# Patient Record
Sex: Male | Born: 1975 | Race: White | Hispanic: No | Marital: Married | Smoking: Never smoker
Health system: Southern US, Community
[De-identification: ages and names within clinical notes are randomized; demographics above are authoritative.]

## PROBLEM LIST (undated history)

## (undated) HISTORY — PX: LIPECTOMY: SHX11

## (undated) HISTORY — PX: CHOLECYSTECTOMY: SHX55

---

## 2011-12-20 ENCOUNTER — Ambulatory Visit (INDEPENDENT_AMBULATORY_CARE_PROVIDER_SITE_OTHER): Payer: BC Managed Care – PPO | Admitting: Family Medicine

## 2011-12-20 ENCOUNTER — Encounter: Payer: Self-pay | Admitting: Family Medicine

## 2011-12-20 VITALS — BP 123/79 | HR 93 | Temp 98.4°F | Ht 70.0 in | Wt 225.0 lb

## 2011-12-20 DIAGNOSIS — M79609 Pain in unspecified limb: Secondary | ICD-10-CM

## 2011-12-20 DIAGNOSIS — M79669 Pain in unspecified lower leg: Secondary | ICD-10-CM

## 2011-12-20 NOTE — Patient Instructions (Signed)
You have strained your medial right calf. Compression sleeve or ace wrap to help with swelling and pain - wear during day when up and walking around, definitely with exercise until pain has resolved. Icing for 15 minutes at a time 3-4 times a day, after exercise. Do 5-10 minute warmup jog then stretch your calf muscles - hold for 20 seconds, repeat 3 times. Heel lifts either in temporary orthotics or on their own to prevent further strain. Tylenol and/or aleve for pain. Heel raise exercise when able - toes forward, pointed out, pointed in - start with 3 sets of 6 - advance to 3 sets of 10.  When tolerated you can add a backpack.  Final step is to do these exercises on a step with a little bit of lowering. Avoid hills, inclines as much as possible. Cycling with low resistance or swimming for cross training in meantime. Follow up with me in 6 weeks for reevaluation.

## 2011-12-21 ENCOUNTER — Encounter: Payer: Self-pay | Admitting: Family Medicine

## 2011-12-21 DIAGNOSIS — M79669 Pain in unspecified lower leg: Secondary | ICD-10-CM | POA: Insufficient documentation

## 2011-12-21 NOTE — Assessment & Plan Note (Signed)
Right calf strain - prior issues with heel cords, PF, achilles tendinitis - has made adjustments to his activities (new basketball shoes, running shoes), and injured knee and ankle recently - all likely contributory.  No evidence DVT by exam and reproduced with MSK maneuvers.  Heel lifts provided, shown home strengthening and stretching program to do daily for next 6 weeks.  Avoid hills as much as possible.  Icing, tylenol/nsaids for pain.  Calf sleeve provided for compression.  Consider formal PT if not improving as expected.  See instructions for further.

## 2011-12-21 NOTE — Progress Notes (Signed)
  Subjective:    Patient ID: Stephen Osborn, male    DOB: January 12, 1976, 36 y.o.   MRN: [id number]  PCP: Dr. Leonette Most  HPI 36 yo M here for R calf injury  Patient reports he has previously had problems with plantar fascia and achilles (last year). 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 Runs and does circuit training. He has done several things last few weeks that thinks has contributed to right medial calf pain. Foot problems recurred in early March, had a right knee injury 2-3 weeks before calf injury/pain started. Turned his left ankle in basketball as well. Also tried finger shoes - shortly after this developed bilateral calf tightness medially - left side improved while right side had persisted. No chest pain, shortness of breath. Has improved some since this started. Has orthotics and has been doing achilles stretching exercises. Been cross training with swimming, elliptical, stationary bike. Occasional ibuprofen. Not using a sleeve or heel lifts in running shoes.  Uses the orthotics in regular shoes.  History reviewed. No pertinent past medical history.  No current outpatient prescriptions on file prior to visit.    Past Surgical History  Procedure Date  . Cholecystectomy     No Known Allergies  History   Social History  . Marital Status: Married    Spouse Name: N/A    Number of Children: N/A  . Years of Education: N/A   Occupational History  . Not on file.   Social History Main Topics  . Smoking status: Never Smoker   . Smokeless tobacco: Not on file  . Alcohol Use: Not on file  . Drug Use: Not on file  . Sexually Active: Not on file   Other Topics Concern  . Not on file   Social History Narrative  . No narrative on file    Family History  Problem Relation Age of Onset  . Sudden death Neg Hx   . Hypertension Neg Hx   . Heart attack Neg Hx   . Diabetes Neg Hx   . Hyperlipidemia Neg Hx       BP 123/79  Pulse 93  Temp(Src) 98.4 F (36.9 C) (Oral)  Ht 5\' 10"  (1.778 m)  Wt 225 lb (102.059 kg)  BMI 32.28 kg/m2  Review of Systems See HPI above.    Objective:   Physical Exam Gen: NAD  R leg: No gross deformity, swelling, bruising, erythema, cords. TTP medial gastroc reproducing his pain.  No achilles, foot or ankle TTP. FROM ankle and knee.  Pain reproduced with calf raise.  Negative homans. Strength 5/5 with all motions of ankle. NVI distally.  L leg: No gross deformity, swelling, bruising, erythema. No focal TTP within calf. FROM ankle and knee with 5/5 strength all ankle motions. NVI distally.     Assessment & Plan:  1. Right calf strain - prior issues with heel cords, PF, achilles tendinitis - has made adjustments to his activities (new basketball shoes, running shoes), and injured knee and ankle recently - all likely contributory.  No evidence DVT by exam and reproduced with MSK maneuvers.  Heel lifts provided, shown home strengthening and stretching program to do daily for next 6 weeks.  Avoid hills as much as possible.  Icing, tylenol/nsaids for pain.  Calf sleeve provided for compression.  Consider formal PT if not improving as expected.  See instructions for further.

## 2012-01-31 ENCOUNTER — Ambulatory Visit: Payer: BC Managed Care – PPO | Admitting: Family Medicine

## 2012-09-24 ENCOUNTER — Ambulatory Visit (INDEPENDENT_AMBULATORY_CARE_PROVIDER_SITE_OTHER): Payer: BC Managed Care – PPO | Admitting: Family Medicine

## 2012-09-24 ENCOUNTER — Encounter: Payer: Self-pay | Admitting: Family Medicine

## 2012-09-24 VITALS — BP 131/75 | HR 67 | Ht 70.0 in | Wt 214.0 lb

## 2012-09-24 DIAGNOSIS — M25561 Pain in right knee: Secondary | ICD-10-CM

## 2012-09-24 DIAGNOSIS — M25569 Pain in unspecified knee: Secondary | ICD-10-CM

## 2012-09-24 DIAGNOSIS — M7651 Patellar tendinitis, right knee: Secondary | ICD-10-CM

## 2012-09-24 DIAGNOSIS — M7650 Patellar tendinitis, unspecified knee: Secondary | ICD-10-CM

## 2012-09-24 MED ORDER — NITROGLYCERIN 0.2 MG/HR TD PT24: MEDICATED_PATCH | TRANSDERMAL | Status: DC

## 2012-09-24 NOTE — Patient Instructions (Addendum)
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 Start with physical therapy and home exercise program - do exercises daily for next 6 weeks. Ice 15 minutes at a time 3-4 times a day as needed. Aleve 2 tabs twice a day OR ibuprofen 3 tabs three times a day with food for pain and inflammation. Avoid jumping activities, deep squats, lunges, stairmaster while you're rehabilitating this. Otherwise it's activities as tolerated. Use 1/4th of a nitro patch (will ask pharmacy to cut this for you if they can) over the affected knee and change this every day - put in a little different area each day. If headaches are severe you can stop and give me a call but they're usually very mild, go away within a week if you get them. Patellar strap may help as well but you've tried this already. Follow up with me in 6 weeks for reevaluation.

## 2012-09-24 NOTE — Progress Notes (Signed)
  Subjective:    Patient ID: Stephen Osborn, male    DOB: [birth year], 37 y.o.   MRN: [id number]  PCP: Dr. Leonette Most  HPI 37 yo M here for right knee pain.  Patient reports he's had pain in right knee for about a year. Pain has been anterior and not gone away despite rest. Worse after he plays basketball, going down stairs. No catching, locking, giving out. Questionable swelling previously. Has been alternating ice and heat, taking ibuprofen. Tried patellar strap without much relief.  History reviewed. No pertinent past medical history.  Current Outpatient Prescriptions on File Prior to Visit  Medication Sig Dispense Refill  . glucosamine-chondroitin 500-400 MG tablet Take 1 tablet by mouth 3 (three) times daily.      . Multiple Vitamin (MULTIVITAMIN WITH MINERALS) TABS Take 1 tablet by mouth daily.       No current facility-administered medications on file prior to visit.    Past Surgical History  Procedure Laterality Date  . Cholecystectomy      No Known Allergies  History   Social History  . Marital Status: Married    Spouse Name: N/A    Number of Children: N/A  . Years of Education: N/A   Occupational History  . Not on file.   Social History Main Topics  . Smoking status: Never Smoker   . Smokeless tobacco: Not on file  . Alcohol Use: Not on file  . Drug Use: Not on file  . Sexually Active: Not on file   Other Topics Concern  . Not on file   Social History Narrative  . No narrative on file    Family History  Problem Relation Age of Onset  . Sudden death Neg Hx   . Hypertension Neg Hx   . Heart attack Neg Hx   . Diabetes Neg Hx   . Hyperlipidemia Neg Hx     BP 131/75  Pulse 67  Ht 5\' 10"  (1.778 m)  Wt 214 lb (97.07 kg)  BMI 30.71 kg/m2  Review of Systems See HPI above.    Objective:   Physical Exam Gen: NAD  R knee: No gross deformity, ecchymoses, swelling. TTP patellar tendon.  No joint line, post patellar facet, other  TTP. FROM. Negative ant/post drawers. Negative valgus/varus testing. Negative lachmanns. Negative mcmurrays, apleys, patellar apprehension, clarkes, thessalys. NV intact distally.    Assessment & Plan:  1. Right knee pain - 2/2 patellar tendinopathy.  Start formal PT, home exercise program.  Icing, nsaids.  Avoid jumping, deep squats and lunges.  Given length of time this has been going on will also trial nitro patches.  Patellar strap hasn't been helpful to him.  F/u in 6 weeks for reevaluation.  No evidence of intraarticular pathology.

## 2012-09-24 NOTE — Assessment & Plan Note (Signed)
2/2 patellar tendinopathy.  Start formal PT, home exercise program.  Icing, nsaids.  Avoid jumping, deep squats and lunges.  Given length of time this has been going on will also trial nitro patches.  Patellar strap hasn't been helpful to him.  F/u in 6 weeks for reevaluation.  No evidence of intraarticular pathology.

## 2012-09-25 ENCOUNTER — Ambulatory Visit: Payer: BC Managed Care – PPO | Attending: Family Medicine | Admitting: Rehabilitation

## 2012-09-25 DIAGNOSIS — M25569 Pain in unspecified knee: Secondary | ICD-10-CM | POA: Insufficient documentation

## 2012-09-25 DIAGNOSIS — IMO0001 Reserved for inherently not codable concepts without codable children: Secondary | ICD-10-CM | POA: Insufficient documentation

## 2012-09-27 ENCOUNTER — Ambulatory Visit: Payer: BC Managed Care – PPO | Admitting: Rehabilitation

## 2012-10-03 ENCOUNTER — Ambulatory Visit: Payer: BC Managed Care – PPO | Admitting: Rehabilitation

## 2012-10-09 ENCOUNTER — Ambulatory Visit: Payer: BC Managed Care – PPO | Admitting: Rehabilitation

## 2012-10-11 ENCOUNTER — Ambulatory Visit: Payer: BC Managed Care – PPO | Admitting: Rehabilitation

## 2012-10-14 ENCOUNTER — Ambulatory Visit: Payer: BC Managed Care – PPO | Admitting: Rehabilitation

## 2012-10-16 ENCOUNTER — Ambulatory Visit: Payer: BC Managed Care – PPO | Attending: Family Medicine | Admitting: Rehabilitation

## 2012-10-16 DIAGNOSIS — M25569 Pain in unspecified knee: Secondary | ICD-10-CM | POA: Insufficient documentation

## 2012-10-16 DIAGNOSIS — IMO0001 Reserved for inherently not codable concepts without codable children: Secondary | ICD-10-CM | POA: Insufficient documentation

## 2012-10-22 ENCOUNTER — Ambulatory Visit: Payer: BC Managed Care – PPO | Admitting: Rehabilitation

## 2012-10-24 ENCOUNTER — Ambulatory Visit: Payer: BC Managed Care – PPO | Admitting: Rehabilitation

## 2012-11-05 ENCOUNTER — Ambulatory Visit: Payer: BC Managed Care – PPO | Admitting: Family Medicine

## 2012-11-07 ENCOUNTER — Encounter: Payer: Self-pay | Admitting: Family Medicine

## 2012-11-07 ENCOUNTER — Ambulatory Visit (INDEPENDENT_AMBULATORY_CARE_PROVIDER_SITE_OTHER): Payer: BC Managed Care – PPO | Admitting: Family Medicine

## 2012-11-07 VITALS — BP 122/72 | HR 67 | Ht 70.0 in | Wt 219.0 lb

## 2012-11-07 DIAGNOSIS — M25561 Pain in right knee: Secondary | ICD-10-CM

## 2012-11-07 DIAGNOSIS — M25569 Pain in unspecified knee: Secondary | ICD-10-CM

## 2012-11-07 NOTE — Patient Instructions (Addendum)
Continue nitro patches, exercises. Follow up in 6 weeks.

## 2012-11-07 NOTE — Assessment & Plan Note (Signed)
2/2 patellar tendinopathy.  Doing very well, improved.  Not much tenderness now either.  Continue home exercises, icing, nsaids as needed.  Nitro patches for at least another 6 weeks then will reassess.  Avoid cutting, jumping sports though he probably would do ok with these (doing so as a precaution).  F/u in 6 weeks.

## 2012-11-07 NOTE — Progress Notes (Signed)
  Subjective:    Patient ID: Stephen Osborn, male    DOB: 1976/03/29, 37 y.o.   MRN: [id number]  PCP: Dr. Leonette Most  HPI  37 yo M here for f/u right knee pain.  3/11: Patient reports he's had pain in right knee for about a year. Pain has been anterior and not gone away despite rest. Worse after he plays basketball, going down stairs. No catching, locking, giving out. Questionable swelling previously. Has been alternating ice and heat, taking ibuprofen. Tried patellar strap without much relief.  4/24: Patient reports he has improved quite a bit since last visit. Doing physical therapy and home exercises. Tolerating nitro patches - had headache for first week but completely resolved now. Able to run now - up to 3 miles. Afraid to do any basketball, regular jumping sports because he doesn't feel it's ready yet.  History reviewed. No pertinent past medical history.  Current Outpatient Prescriptions on File Prior to Visit  Medication Sig Dispense Refill  . glucosamine-chondroitin 500-400 MG tablet Take 1 tablet by mouth 3 (three) times daily.      . Multiple Vitamin (MULTIVITAMIN WITH MINERALS) TABS Take 1 tablet by mouth daily.      . nitroGLYCERIN (NITRODUR - DOSED IN MG/24 HR) 0.2 mg/hr 1/4th patch over affected patellar tendon - change daily  30 patch  1   No current facility-administered medications on file prior to visit.    Past Surgical History  Procedure Laterality Date  . Cholecystectomy      No Known Allergies  History   Social History  . Marital Status: Married    Spouse Name: N/A    Number of Children: N/A  . Years of Education: N/A   Occupational History  . Not on file.   Social History Main Topics  . Smoking status: Never Smoker   . Smokeless tobacco: Not on file  . Alcohol Use: Not on file  . Drug Use: Not on file  . Sexually Active: Not on file   Other Topics Concern  . Not on file   Social History Narrative  . No narrative on file    Family  History  Problem Relation Age of Onset  . Sudden death Neg Hx   . Hypertension Neg Hx   . Heart attack Neg Hx   . Diabetes Neg Hx   . Hyperlipidemia Neg Hx     BP 122/72  Pulse 67  Ht 5\' 10"  (1.778 m)  Wt 219 lb (99.338 kg)  BMI 31.42 kg/m2  Review of Systems  See HPI above.    Objective:   Physical Exam  Gen: NAD  R knee: No gross deformity, ecchymoses, swelling. Mild TTP patellar tendon.  No joint line, post patellar facet, other TTP. FROM. Negative ant/post drawers. Negative valgus/varus testing. Negative lachmanns. Negative mcmurrays, apleys, patellar apprehension, clarkes, thessalys. NV intact distally.    Assessment & Plan:  1. Right knee pain - 2/2 patellar tendinopathy.  Doing very well, improved.  Not much tenderness now either.  Continue home exercises, icing, nsaids as needed.  Nitro patches for at least another 6 weeks then will reassess.  Avoid cutting, jumping sports though he probably would do ok with these (doing so as a precaution).  F/u in 6 weeks.

## 2012-12-19 ENCOUNTER — Ambulatory Visit: Payer: BC Managed Care – PPO | Admitting: Family Medicine

## 2012-12-20 ENCOUNTER — Encounter: Payer: Self-pay | Admitting: Family Medicine

## 2012-12-20 ENCOUNTER — Ambulatory Visit (INDEPENDENT_AMBULATORY_CARE_PROVIDER_SITE_OTHER): Payer: BC Managed Care – PPO | Admitting: Family Medicine

## 2012-12-20 VITALS — BP 122/79 | HR 71 | Ht 70.0 in | Wt 216.0 lb

## 2012-12-20 DIAGNOSIS — M25569 Pain in unspecified knee: Secondary | ICD-10-CM

## 2012-12-20 DIAGNOSIS — M25561 Pain in right knee: Secondary | ICD-10-CM

## 2012-12-20 NOTE — Progress Notes (Signed)
Subjective:    Patient ID: Stephen Osborn, male    DOB: 04-Oct-1975, 37 y.o.   MRN: [id number]  PCP: Dr. Leonette Most  HPI  37 yo M here for f/u right knee pain.  3/11: Patient reports he's had pain in right knee for about a year. Pain has been anterior and not gone away despite rest. Worse after he plays basketball, going down stairs. No catching, locking, giving out. Questionable swelling previously. Has been alternating ice and heat, taking ibuprofen. Tried patellar strap without much relief.  4/24: Patient reports he has improved quite a bit since last visit. Doing physical therapy and home exercises. Tolerating nitro patches - had headache for first week but completely resolved now. Able to run now - up to 3 miles. Afraid to do any basketball, regular jumping sports because he doesn't feel it's ready yet.  6/6: Patient reports he feels a little bit worse or the same compared to last visit. Still struggling with anterior knee pain. Doing home exercises and stretches. Using nitro patches regularly as directed. Pain worse before and after activities - eases up during activities.  History reviewed. No pertinent past medical history.  Current Outpatient Prescriptions on File Prior to Visit  Medication Sig Dispense Refill  . glucosamine-chondroitin 500-400 MG tablet Take 1 tablet by mouth 3 (three) times daily.      . Multiple Vitamin (MULTIVITAMIN WITH MINERALS) TABS Take 1 tablet by mouth daily.      . nitroGLYCERIN (NITRODUR - DOSED IN MG/24 HR) 0.2 mg/hr 1/4th patch over affected patellar tendon - change daily  30 patch  1   No current facility-administered medications on file prior to visit.    Past Surgical History  Procedure Laterality Date  . Cholecystectomy      No Known Allergies  History   Social History  . Marital Status: Married    Spouse Name: N/A    Number of Children: N/A  . Years of Education: N/A   Occupational History  . Not on file.   Social  History Main Topics  . Smoking status: Never Smoker   . Smokeless tobacco: Not on file  . Alcohol Use: Not on file  . Drug Use: Not on file  . Sexually Active: Not on file   Other Topics Concern  . Not on file   Social History Narrative  . No narrative on file    Family History  Problem Relation Age of Onset  . Sudden death Neg Hx   . Hypertension Neg Hx   . Heart attack Neg Hx   . Diabetes Neg Hx   . Hyperlipidemia Neg Hx     BP 122/79  Pulse 71  Ht 5\' 10"  (1.778 m)  Wt 216 lb (97.977 kg)  BMI 30.99 kg/m2  Review of Systems  See HPI above.    Objective:   Physical Exam  Gen: NAD  R knee: No gross deformity, ecchymoses, swelling. Mild-mod TTP patellar tendon.  No joint line, post patellar facet, other TTP. FROM. Negative ant/post drawers. Negative valgus/varus testing. Negative lachmanns. Negative mcmurrays, apleys, patellar apprehension. NV intact distally.    Assessment & Plan:  1. Right knee pain - 2/2 patellar tendinopathy.  Unfortunately his recovery has plateaued or worsened since last visit.  Will continue with nitro - ensure he's doing eccentric exercise (wasn't doing this regularly - hadn't been shown it).  Continue other exercises as well.  Icing, nsaids.  Refer for consideration of ART.  Consider PRP if after 6  weeks he's not noting improvement.

## 2012-12-20 NOTE — Patient Instructions (Addendum)
Continue your nitro patches - call me if you need a refill. Do the decline squats - 3 sets of 10-15 once a day. Call Thereasa Distance at [facility] for evaluation for Active Release of patellar tendinopathy - [phone] If after 6 weeks you're not seeing results let me know and we can refer you to discuss PRP treatment. Otherwise follow-up with me in 3 months.

## 2012-12-20 NOTE — Assessment & Plan Note (Signed)
2/2 patellar tendinopathy.  Unfortunately his recovery has plateaued or worsened since last visit.  Will continue with nitro - ensure he's doing eccentric exercise (wasn't doing this regularly - hadn't been shown it).  Continue other exercises as well.  Icing, nsaids.  Refer for consideration of ART.  Consider PRP if after 6 weeks he's not noting improvement.

## 2013-04-08 ENCOUNTER — Ambulatory Visit (INDEPENDENT_AMBULATORY_CARE_PROVIDER_SITE_OTHER): Payer: BC Managed Care – PPO | Admitting: Podiatry

## 2013-04-08 ENCOUNTER — Encounter: Payer: Self-pay | Admitting: Podiatry

## 2013-04-08 VITALS — BP 138/69 | HR 67 | Ht 69.5 in | Wt 217.0 lb

## 2013-04-08 DIAGNOSIS — M7750 Other enthesopathy of unspecified foot: Secondary | ICD-10-CM

## 2013-04-08 DIAGNOSIS — M7742 Metatarsalgia, left foot: Secondary | ICD-10-CM

## 2013-04-08 DIAGNOSIS — M21969 Unspecified acquired deformity of unspecified lower leg: Secondary | ICD-10-CM

## 2013-04-08 NOTE — Progress Notes (Signed)
Subjective: When walk or run, the 2nd MPJ area feel like a pebble x 5 weeks.  At the end of running (3 miles) feels more pain. Had pulled high Achilles tendon on left. Was very tight for the first 2-3 weeks and then got better. Now the foot issue came up and still not able to get back to running.  He was seen here 2 years ago for plantar fasciitis on right foot.   Objective: Positive for excess motion and weakened first Metatarsocuneiform joint left,  Enlarged 2nd MPJ left.  Assessment: Capsulitis 2nd MPJ left. Hypermobile first ray left.  Plan: Reviewed biomechanics of left foot pain. Excess first ray motion or weakness of the first MPJ case the weight bearing mechanics altered. Weight is transferring from the first MPJ to the 2nd MPJ extended time period, which will cause the 2nd MPJ  swollen with pain.  Will prepare Orthotic shoe inserts and meantime use Metatarsal binder.  Return next week to prepare for Orthotics.

## 2013-04-08 NOTE — Patient Instructions (Addendum)
Seen for pain on left foot.  Finding reveal excess motion and weakened first Metatarsocuneiform joint left, from which the weight bearing mechanics altered. Weight is transferring from the first MPJ to the 2nd MPJ excessively and longer period. This cause the 2nd MPJ  swollen with pain.  Will prepare Orthotic shoe inserts and use Metatarsal binder.

## 2013-04-09 DIAGNOSIS — M21969 Unspecified acquired deformity of unspecified lower leg: Secondary | ICD-10-CM | POA: Insufficient documentation

## 2013-04-09 DIAGNOSIS — M7742 Metatarsalgia, left foot: Secondary | ICD-10-CM | POA: Insufficient documentation

## 2013-04-09 MED ORDER — ARCH BANDAGE MISC: 1.0000 | Freq: Every morning | Status: DC

## 2013-04-21 ENCOUNTER — Encounter: Payer: Self-pay | Admitting: Podiatry

## 2013-04-21 ENCOUNTER — Ambulatory Visit (INDEPENDENT_AMBULATORY_CARE_PROVIDER_SITE_OTHER): Payer: BC Managed Care – PPO | Admitting: Podiatry

## 2013-04-21 DIAGNOSIS — M7750 Other enthesopathy of unspecified foot: Secondary | ICD-10-CM

## 2013-04-21 DIAGNOSIS — M21962 Unspecified acquired deformity of left lower leg: Secondary | ICD-10-CM

## 2013-04-21 DIAGNOSIS — M7742 Metatarsalgia, left foot: Secondary | ICD-10-CM

## 2013-04-21 DIAGNOSIS — M21969 Unspecified acquired deformity of unspecified lower leg: Secondary | ICD-10-CM

## 2013-04-21 NOTE — Progress Notes (Signed)
Foot is doing a bit better.  Had left 2nd MPJ popped at Chiropractor's office.  He popped at home while bathing.  Still doing stretch exercise for left Achilles tendon.  Assessment: Metatarsalgia 2nd left. Hypermobile first ray bilateral. Ankle Equinus left.  Plan:  Today both feet were casted.

## 2013-04-21 NOTE — Patient Instructions (Addendum)
Seen for Metatarsalgia, 2nd MPJ left. Both feet were casted for Orthotics. Will call when they are ready.

## 2014-03-25 ENCOUNTER — Emergency Department (HOSPITAL_BASED_OUTPATIENT_CLINIC_OR_DEPARTMENT_OTHER): Payer: 59

## 2014-03-25 ENCOUNTER — Encounter (HOSPITAL_BASED_OUTPATIENT_CLINIC_OR_DEPARTMENT_OTHER): Payer: Self-pay | Admitting: Emergency Medicine

## 2014-03-25 ENCOUNTER — Emergency Department (HOSPITAL_BASED_OUTPATIENT_CLINIC_OR_DEPARTMENT_OTHER)
Admission: EM | Admit: 2014-03-25 | Discharge: 2014-03-25 | Disposition: A | Discharge status: Home / Self Care | Payer: 59 | Attending: Emergency Medicine | Admitting: Emergency Medicine

## 2014-03-25 DIAGNOSIS — R110 Nausea: Secondary | ICD-10-CM | POA: Diagnosis not present

## 2014-03-25 DIAGNOSIS — R197 Diarrhea, unspecified: Secondary | ICD-10-CM | POA: Insufficient documentation

## 2014-03-25 DIAGNOSIS — R1033 Periumbilical pain: Secondary | ICD-10-CM | POA: Insufficient documentation

## 2014-03-25 DIAGNOSIS — Z79899 Other long term (current) drug therapy: Secondary | ICD-10-CM | POA: Insufficient documentation

## 2014-03-25 DIAGNOSIS — Z9089 Acquired absence of other organs: Secondary | ICD-10-CM | POA: Diagnosis not present

## 2014-03-25 DIAGNOSIS — R1013 Epigastric pain: Secondary | ICD-10-CM | POA: Insufficient documentation

## 2014-03-25 LAB — CBC WITH DIFFERENTIAL/PLATELET
Basophils Absolute: 0 10*3/uL (ref 0.0–0.1)
Basophils Relative: 0 % (ref 0–1)
EOS ABS: 2.1 10*3/uL — AB (ref 0.0–0.7)
Eosinophils Relative: 16 % — ABNORMAL HIGH (ref 0–5)
HCT: 45.5 % (ref 39.0–52.0)
HEMOGLOBIN: 16.4 g/dL (ref 13.0–17.0)
LYMPHS ABS: 2.4 10*3/uL (ref 0.7–4.0)
LYMPHS PCT: 19 % (ref 12–46)
MCH: 29.9 pg (ref 26.0–34.0)
MCHC: 36 g/dL (ref 30.0–36.0)
MCV: 83 fL (ref 78.0–100.0)
MONOS PCT: 7 % (ref 3–12)
Monocytes Absolute: 1 10*3/uL (ref 0.1–1.0)
Neutro Abs: 7.4 10*3/uL (ref 1.7–7.7)
Neutrophils Relative %: 58 % (ref 43–77)
PLATELETS: 244 10*3/uL (ref 150–400)
RBC: 5.48 MIL/uL (ref 4.22–5.81)
RDW: 13.1 % (ref 11.5–15.5)
WBC: 12.8 10*3/uL — AB (ref 4.0–10.5)

## 2014-03-25 LAB — COMPREHENSIVE METABOLIC PANEL
ALK PHOS: 62 U/L (ref 39–117)
ALT: 32 U/L (ref 0–53)
ANION GAP: 13 (ref 5–15)
AST: 24 U/L (ref 0–37)
Albumin: 3.7 g/dL (ref 3.5–5.2)
BUN: 12 mg/dL (ref 6–23)
CO2: 22 meq/L (ref 19–32)
Calcium: 9.5 mg/dL (ref 8.4–10.5)
Chloride: 104 mEq/L (ref 96–112)
Creatinine, Ser: 1.2 mg/dL (ref 0.50–1.35)
GFR, EST AFRICAN AMERICAN: 87 mL/min — AB (ref >90)
GFR, EST NON AFRICAN AMERICAN: 75 mL/min — AB (ref >90)
GLUCOSE: 129 mg/dL — AB (ref 70–99)
Potassium: 4 mEq/L (ref 3.7–5.3)
SODIUM: 139 meq/L (ref 137–147)
TOTAL PROTEIN: 6.6 g/dL (ref 6.0–8.3)
Total Bilirubin: 1.5 mg/dL — ABNORMAL HIGH (ref 0.3–1.2)

## 2014-03-25 LAB — URINALYSIS, ROUTINE W REFLEX MICROSCOPIC
Glucose, UA: NEGATIVE mg/dL
HGB URINE DIPSTICK: NEGATIVE
Ketones, ur: NEGATIVE mg/dL
Leukocytes, UA: NEGATIVE
Nitrite: NEGATIVE
PROTEIN: NEGATIVE mg/dL
Specific Gravity, Urine: 1.025 (ref 1.005–1.030)
Urobilinogen, UA: 0.2 mg/dL (ref 0.0–1.0)
pH: 5 (ref 5.0–8.0)

## 2014-03-25 LAB — LIPASE, BLOOD: Lipase: 27 U/L (ref 11–59)

## 2014-03-25 MED ORDER — IOHEXOL 300 MG/ML  SOLN
50.0000 mL | Freq: Once | INTRAMUSCULAR | Status: AC | PRN
  Administered 2014-03-25: 50 mL via ORAL

## 2014-03-25 MED ORDER — HYDROCODONE-ACETAMINOPHEN 5-325 MG PO TABS: 1.0000 | ORAL_TABLET | Freq: Four times a day (QID) | ORAL | Status: DC | PRN

## 2014-03-25 MED ORDER — ONDANSETRON HCL 4 MG/2ML IJ SOLN
4.0000 mg | Freq: Once | INTRAMUSCULAR | Status: AC
  Administered 2014-03-25: 4 mg via INTRAVENOUS
  Filled 2014-03-25: qty 2

## 2014-03-25 MED ORDER — SUCRALFATE 1 GM/10ML PO SUSP: 1.0000 g | Freq: Three times a day (TID) | ORAL | Status: DC

## 2014-03-25 MED ORDER — IOHEXOL 300 MG/ML  SOLN
100.0000 mL | Freq: Once | INTRAMUSCULAR | Status: AC | PRN
  Administered 2014-03-25: 100 mL via INTRAVENOUS

## 2014-03-25 MED ORDER — SODIUM CHLORIDE 0.9 % IV SOLN
1000.0000 mL | Freq: Once | INTRAVENOUS | Status: AC
  Administered 2014-03-25: 1000 mL via INTRAVENOUS

## 2014-03-25 MED ORDER — GI COCKTAIL ~~LOC~~
30.0000 mL | Freq: Once | ORAL | Status: AC
  Administered 2014-03-25: 30 mL via ORAL
  Filled 2014-03-25: qty 30

## 2014-03-25 MED ORDER — OMEPRAZOLE 20 MG PO CPDR: 20.0000 mg | DELAYED_RELEASE_CAPSULE | Freq: Every day | ORAL | Status: DC

## 2014-03-25 NOTE — ED Notes (Signed)
Reports diffuse abdominal pain x 1 week. sts it fluctuates at times. Reports pain and nausea after eating.

## 2014-03-25 NOTE — Discharge Instructions (Signed)
As discussed, it is important that you follow up with your physician for continued management of your condition.  Your pain is likely due to esophagitis.  If you develop any new, or concerning changes in your condition, please return to the emergency department immediately.

## 2014-03-25 NOTE — ED Provider Notes (Signed)
CSN: [id number]     Arrival date & time 03/25/14  1614 History   First MD Initiated Contact with Patient 03/25/14 1622     Chief Complaint  Patient presents with  . Abdominal Pain      HPI  Patient presents with concern of ongoing abdominal pain. The pain is severe, sharp, periumbilical, epigastric, nonradiating. Pain is present after oral intake, without associated vomiting.  There is associated nausea, and increased frequency of stool, as well as loosening of stool, without blood in the stool. There is no right upper quadrant pain, no right lower quadrant pain. No clear precipitating factors, no relief with OTC medication. There is no pain with supine positioning absent by mouth intake. Patient denies fevers, chills, chest pain, dyspnea, other focal changes from his baseline condition. Patient has a history of cholecystectomy. He notes that the symptoms are similar to those he experienced prior to that surgery.   History reviewed. No pertinent past medical history. Past Surgical History  Procedure Laterality Date  . Cholecystectomy     Family History  Problem Relation Age of Onset  . Sudden death Neg Hx   . Hypertension Neg Hx   . Heart attack Neg Hx   . Diabetes Neg Hx   . Hyperlipidemia Neg Hx    History  Substance Use Topics  . Smoking status: Never Smoker   . Smokeless tobacco: Never Used  . Alcohol Use: No    Review of Systems  Constitutional:       Per HPI, otherwise negative  HENT:       Per HPI, otherwise negative  Respiratory:       Per HPI, otherwise negative  Cardiovascular:       Per HPI, otherwise negative  Gastrointestinal: Negative for vomiting.  Endocrine:       Negative aside from HPI  Genitourinary:       Neg aside from HPI   Musculoskeletal:       Per HPI, otherwise negative  Skin: Negative.   Neurological: Negative for syncope.      Allergies  Review of patient's allergies indicates no known allergies.  Home Medications   Prior  to Admission medications   Medication Sig Start Date End Date Taking? Authorizing Provider  econazole nitrate 1 % cream  02/13/13   Historical Provider, MD  fish oil-omega-3 fatty acids 1000 MG capsule Take 2 g by mouth daily.    Historical Provider, MD  glucosamine-chondroitin 500-400 MG tablet Take 1 tablet by mouth daily.     Historical Provider, MD  Multiple Vitamin (MULTIVITAMIN WITH MINERALS) TABS Take 1 tablet by mouth daily.    Historical Provider, MD   BP 125/80  Pulse 80  Temp(Src) 99 F (37.2 C) (Oral)  Resp 18  Ht 5\' 9"  (1.753 m)  Wt 242 lb (109.77 kg)  BMI 35.72 kg/m2  SpO2 97% Physical Exam  Nursing note and vitals reviewed. Constitutional: He is oriented to person, place, and time. He appears well-developed. No distress.  HENT:  Head: Normocephalic and atraumatic.  Eyes: Conjunctivae and EOM are normal.  Cardiovascular: Normal rate and regular rhythm.   Pulmonary/Chest: Effort normal. No stridor. No respiratory distress.  Abdominal: He exhibits no distension. There is tenderness in the epigastric area and periumbilical area. There is no rigidity, no rebound and no guarding.  Musculoskeletal: He exhibits no edema.  Neurological: He is alert and oriented to person, place, and time.  Skin: Skin is warm and dry.  Psychiatric: He has  a normal mood and affect.    ED Course  Procedures (including critical care time) Labs Review Labs Reviewed  CBC WITH DIFFERENTIAL - Abnormal; Notable for the following:    WBC 12.8 (*)    Eosinophils Relative 16 (*)    Eosinophils Absolute 2.1 (*)    All other components within normal limits  COMPREHENSIVE METABOLIC PANEL - Abnormal; Notable for the following:    Glucose, Bld 129 (*)    Total Bilirubin 1.5 (*)    GFR calc non Af Amer 75 (*)    GFR calc Af Amer 87 (*)    All other components within normal limits  URINALYSIS, ROUTINE W REFLEX MICROSCOPIC - Abnormal; Notable for the following:    APPearance CLOUDY (*)    Bilirubin  Urine SMALL (*)    All other components within normal limits  LIPASE, BLOOD    Imaging Review Ct Abdomen Pelvis W Contrast  03/25/2014   CLINICAL DATA:  Abdominal pain and nausea  EXAM: CT ABDOMEN AND PELVIS WITH CONTRAST  TECHNIQUE: Multidetector CT imaging of the abdomen and pelvis was performed using the standard protocol following bolus administration of intravenous contrast. Oral contrast was also administered.  CONTRAST:  1102mL OMNIPAQUE IOHEXOL 300 MG/ML  SOLN  COMPARISON:  None.  FINDINGS: Lung bases are clear.  Liver is prominent measuring 18.2 cm in length. There is a focal 8 by 9 mm area of decreased attenuation in the posterior segment right lobe of the liver which probably represents a small hemangioma. No other focal liver lesions are identified. Gallbladder is absent. There is no biliary duct dilatation.  Spleen, pancreas, and adrenals appear normal. Kidneys bilaterally show no evidence of mass or hydronephrosis on either side. There is no renal or ureteral calculus on either side. There is a retroaortic left renal vein, an anatomic variant.  In the pelvis, the urinary bladder is midline with normal wall thickness. There is fat in the left inguinal ring. There is no pelvic mass or fluid collection. There are occasional sigmoid diverticula without diverticulitis.  Appendix appears normal.  Terminal ileum appears normal.  There is no bowel obstruction. There is no free air or portal venous air.  There is no ascites, adenopathy by size criteria, or abscess in the abdomen or pelvis. There are scattered subcentimeter mesenteric lymph nodes which must be regarded as nonspecific. There is no appreciable abdominal aortic aneurysm. There are no blastic or lytic bone lesions.  IMPRESSION: Occasional sigmoid diverticula without diverticulitis.  No abscess. Appendix appears normal. No mesenteric inflammation. No bowel obstruction. There are scattered subcentimeter mesenteric lymph nodes which are regarded  as nonspecific. In the appropriate clinical setting, a degree of mesenteric adenitis is possible. The surrounding mesenteric does not appear thickened, however.  Liver prominent with probable small hemangioma in the posterior segment right lobe region. Gallbladder is absent.   Electronically Signed   By: Lowella Grip M.D.   On: 03/25/2014 18:29     EKG Interpretation None     7:09 PM On repeat exam the patient is in no distress, he is substantially better in appearance. He, his wife and I had a lengthy conversation about all results, including the likelihood of esophagitis or mesenteric adenitis as his etiology for pain. Return precautions, follow up instructions all understood by the family. Patient has followup scheduled in 2 days of gastroenterology.  MDM   Final diagnoses:  Epigastric pain    Patient presents with abdominal pain, notably worse after eating. Patient's evaluation  here is largely reassuring, and there is some suspicion for esophagitis or adenitis as etiology. Absent distress, alert and findings the patient was discharged to follow up with gastroenterology.    Carmin Muskrat, MD 03/25/14 1911

## 2014-03-25 NOTE — ED Notes (Signed)
MD at bedside. 

## 2014-10-29 ENCOUNTER — Ambulatory Visit (INDEPENDENT_AMBULATORY_CARE_PROVIDER_SITE_OTHER): Payer: PRIVATE HEALTH INSURANCE | Admitting: Licensed Clinical Social Worker

## 2014-10-29 DIAGNOSIS — F4323 Adjustment disorder with mixed anxiety and depressed mood: Secondary | ICD-10-CM | POA: Diagnosis not present

## 2014-11-10 ENCOUNTER — Ambulatory Visit (INDEPENDENT_AMBULATORY_CARE_PROVIDER_SITE_OTHER): Payer: PRIVATE HEALTH INSURANCE | Admitting: Family Medicine

## 2014-11-10 ENCOUNTER — Encounter: Payer: Self-pay | Admitting: Family Medicine

## 2014-11-10 VITALS — BP 142/80 | HR 102 | Ht 69.0 in | Wt 245.0 lb

## 2014-11-10 DIAGNOSIS — M25512 Pain in left shoulder: Secondary | ICD-10-CM | POA: Diagnosis not present

## 2014-11-10 DIAGNOSIS — M25562 Pain in left knee: Secondary | ICD-10-CM | POA: Diagnosis not present

## 2014-11-10 NOTE — Patient Instructions (Signed)
You have a knee effusion - this is typically from early arthritis, a small meniscus tear, or something like gout. Typically however gout would be very painful and you're very young to have arthritis. All of these are treated relatively similarly. Consider ibuprofen 600mg  three times a day with food OR Aleve 2 tabs twice a day with food Continue your glucosamine.   Let me know if this is still very limiting and we can try a cortisone injection. Limit deep squats, lunges, leg press.   Icing 15 minutes at a time after working out. Compression after working out also and as needed. Elevation above your heart as needed for swelling.  You have rotator cuff impingement Subacromial injection may be beneficial to help with pain and to decrease inflammation if this becomes severe. Consider physical therapy with transition to home exercise program. Do home exercise program with theraband and scapular stabilization exercises daily - these are very important for long term relief even if an injection was given. If not improving at follow-up we will consider further imaging, injection, physical therapy, and/or nitro patches. Follow up with me as needed or in 6 weeks.

## 2014-11-12 ENCOUNTER — Encounter: Payer: Self-pay | Admitting: Family Medicine

## 2014-11-12 ENCOUNTER — Ambulatory Visit (INDEPENDENT_AMBULATORY_CARE_PROVIDER_SITE_OTHER): Payer: PRIVATE HEALTH INSURANCE | Admitting: Family Medicine

## 2014-11-12 VITALS — BP 137/90 | HR 83 | Ht 69.0 in | Wt 245.0 lb

## 2014-11-12 DIAGNOSIS — M25562 Pain in left knee: Secondary | ICD-10-CM | POA: Insufficient documentation

## 2014-11-12 DIAGNOSIS — S76312A Strain of muscle, fascia and tendon of the posterior muscle group at thigh level, left thigh, initial encounter: Secondary | ICD-10-CM

## 2014-11-12 DIAGNOSIS — M25512 Pain in left shoulder: Secondary | ICD-10-CM | POA: Insufficient documentation

## 2014-11-12 MED ORDER — HYDROCODONE-ACETAMINOPHEN 5-325 MG PO TABS: 1.0000 | ORAL_TABLET | Freq: Four times a day (QID) | ORAL | Status: DC | PRN

## 2014-11-12 NOTE — Patient Instructions (Signed)
You have strained your hamstring. Ice for next 2 days 15 minutes at a time 3-4 times a day - then you can switch to heat if this feels more comfortable. Compression sleeve or ACE wrap until I see you back. Crutches as needed - transition off these starting on Monday. When able to start hamstring curls, swings 3 sets of 10 once a day. Ibuprofen 600mg  three times a day with food for pain and inflammation for next 10 days then as needed. Norco as needed for severe pain - no driving on this medicine. Follow up with me in 7-10 days.

## 2014-11-12 NOTE — Assessment & Plan Note (Signed)
suspect this is due to early arthritis or small meniscus tear.  Gout possible but no history of this, worse with activities, no erythema, and pain level is low for a gout flare.  Icing, compression, elevation.  Avoid deep squats, lunges, leg press.  Consider injection - declined today.

## 2014-11-12 NOTE — Progress Notes (Signed)
PCP: Wonda Cerise, MD  Subjective:   HPI: Patient is a 39 y.o. male here for left knee swelling.  Patient reports about 2 weeks ago after golfing he noticed his left knee was more swollen and stiff. Associated with achiness. Pain superior aspect of knee and lateral. No known injury. Intermittently has flared up since then - worse by end of day. Pain level 3/10 but gets up to 5/10 level. Takes glucosamine, tumeric, fish oil, ibuprofen. No catching, locking, giving out. No history of gout, rheumatoid arthritis. He also reported some left shoulder pain especially when doing pull ups, reaching overhead. No injury.  No past medical history on file.  Current Outpatient Prescriptions on File Prior to Visit  Medication Sig Dispense Refill  . fish oil-omega-3 fatty acids 1000 MG capsule Take 2 g by mouth daily.    Marland Kitchen glucosamine-chondroitin 500-400 MG tablet Take 1 tablet by mouth daily.     . Multiple Vitamin (MULTIVITAMIN WITH MINERALS) TABS Take 1 tablet by mouth daily.    Marland Kitchen omeprazole (PRILOSEC) 20 MG capsule Take 1 capsule (20 mg total) by mouth daily. 15 capsule 0  . sucralfate (CARAFATE) 1 GM/10ML suspension Take 10 mLs (1 g total) by mouth 4 (four) times daily -  with meals and at bedtime. 420 mL 0   No current facility-administered medications on file prior to visit.    Past Surgical History  Procedure Laterality Date  . Cholecystectomy      No Known Allergies  History   Social History  . Marital Status: Married    Spouse Name: N/A  . Number of Children: N/A  . Years of Education: N/A   Occupational History  . Not on file.   Social History Main Topics  . Smoking status: Never Smoker   . Smokeless tobacco: Never Used  . Alcohol Use: No  . Drug Use: Not on file  . Sexual Activity: Not on file   Other Topics Concern  . Not on file   Social History Narrative    Family History  Problem Relation Age of Onset  . Sudden death Neg Hx   . Hypertension Neg  Hx   . Heart attack Neg Hx   . Diabetes Neg Hx   . Hyperlipidemia Neg Hx     BP 142/80 mmHg  Pulse 102  Ht 5\' 9"  (1.753 m)  Wt 245 lb (111.131 kg)  BMI 36.16 kg/m2  Review of Systems: See HPI above.    Objective:  Physical Exam:  Gen: NAD  Left knee: Mild effusion.  No bruising, other deformity. No TTP currently. FROM. Negative ant/post drawers. Negative valgus/varus testing. Negative lachmanns. Negative mcmurrays, apleys, patellar apprehension. NV intact distally.  Left shoulder: No swelling, ecchymoses.  No gross deformity. No TTP. FROM with mild painful arc. Mild positive Hawkins, negative Neers. Negative Yergasons. Strength 5/5 with empty can and resisted internal/external rotation. Negative apprehension. NV intact distally.    Assessment & Plan:  1. Left knee effusion, pain - suspect this is due to early arthritis or small meniscus tear.  Gout possible but no history of this, worse with activities, no erythema, and pain level is low for a gout flare.  Icing, compression, elevation.  Avoid deep squats, lunges, leg press.  Consider injection - declined today.  2.  Left shoulder pain - 2/2 rotator cuff impingement.  Shown home exercises to do daily.  NSAIDs as needed.  Consider injection, nitro patches, imaging, PT if not improving.   F/u in  6 weeks or prn for these issues.

## 2014-11-12 NOTE — Assessment & Plan Note (Signed)
2/2 rotator cuff impingement.  Shown home exercises to do daily.  NSAIDs as needed.  Consider injection, nitro patches, imaging, PT if not improving.   F/u in 6 weeks or prn for these issues.

## 2014-11-16 DIAGNOSIS — S76312A Strain of muscle, fascia and tendon of the posterior muscle group at thigh level, left thigh, initial encounter: Secondary | ICD-10-CM | POA: Insufficient documentation

## 2014-11-16 NOTE — Progress Notes (Signed)
PCP: Wonda Cerise, MD  Subjective:   HPI: Patient is a 39 y.o. male here for left hamstring injury.  Patient was outside walking to his car when he slipped in the rain, left leg went forward and he did the splits. Felt a pull mid-proximal left hamstring with immediate pain. Difficulty sitting, walking now. Taking ibuprofen and using crutches. No prior injury.  No past medical history on file.  Current Outpatient Prescriptions on File Prior to Visit  Medication Sig Dispense Refill  . fish oil-omega-3 fatty acids 1000 MG capsule Take 2 g by mouth daily.    Marland Kitchen glucosamine-chondroitin 500-400 MG tablet Take 1 tablet by mouth daily.     . Multiple Vitamin (MULTIVITAMIN WITH MINERALS) TABS Take 1 tablet by mouth daily.    Marland Kitchen omeprazole (PRILOSEC) 20 MG capsule Take 1 capsule (20 mg total) by mouth daily. 15 capsule 0  . sucralfate (CARAFATE) 1 GM/10ML suspension Take 10 mLs (1 g total) by mouth 4 (four) times daily -  with meals and at bedtime. 420 mL 0  . zaleplon (SONATA) 5 MG capsule   1   No current facility-administered medications on file prior to visit.    Past Surgical History  Procedure Laterality Date  . Cholecystectomy      No Known Allergies  History   Social History  . Marital Status: Married    Spouse Name: N/A  . Number of Children: N/A  . Years of Education: N/A   Occupational History  . Not on file.   Social History Main Topics  . Smoking status: Never Smoker   . Smokeless tobacco: Never Used  . Alcohol Use: No  . Drug Use: Not on file  . Sexual Activity: Not on file   Other Topics Concern  . Not on file   Social History Narrative    Family History  Problem Relation Age of Onset  . Sudden death Neg Hx   . Hypertension Neg Hx   . Heart attack Neg Hx   . Diabetes Neg Hx   . Hyperlipidemia Neg Hx     BP 137/90 mmHg  Pulse 83  Ht 5\' 9"  (1.753 m)  Wt 245 lb (111.131 kg)  BMI 36.16 kg/m2  Review of Systems: See HPI above.     Objective:  Physical Exam:  Gen: NAD  Left leg: No gross deformity, swelling, bruising, palpable defect. TTP mid-hamstring more medial than lateral.  FROM knee and hip. Pain on resisted knee flexion at 90 and 30 degrees - did not fully test strength of these due to severe pain. NVI distally.    Assessment & Plan:  1. Left hamstring strain - icing, compression with crutches as needed.  Ibuprofen with norco as needed.  Shown home exercises to start when pain begins to improve.  F/u in 7-10 days.  Continue PT in future.

## 2014-11-16 NOTE — Assessment & Plan Note (Signed)
icing, compression with crutches as needed.  Ibuprofen with norco as needed.  Shown home exercises to start when pain begins to improve.  F/u in 7-10 days.  Continue PT in future.

## 2014-11-19 ENCOUNTER — Ambulatory Visit (INDEPENDENT_AMBULATORY_CARE_PROVIDER_SITE_OTHER): Payer: PRIVATE HEALTH INSURANCE | Admitting: Family Medicine

## 2014-11-19 ENCOUNTER — Encounter: Payer: Self-pay | Admitting: Family Medicine

## 2014-11-19 VITALS — BP 134/84 | HR 74 | Ht 69.0 in | Wt 245.0 lb

## 2014-11-19 DIAGNOSIS — S76312D Strain of muscle, fascia and tendon of the posterior muscle group at thigh level, left thigh, subsequent encounter: Secondary | ICD-10-CM | POA: Diagnosis not present

## 2014-11-23 NOTE — Assessment & Plan Note (Signed)
Switch to compression sleeve from ACE wrap.  Start strengthening now - can add ankle weight when tolerated.  Consider physical therapy if not improving.  F/u in 4 weeks or prn.

## 2014-11-23 NOTE — Progress Notes (Signed)
PCP: Wonda Cerise, MD  Subjective:   HPI: Patient is a 39 y.o. male here for left hamstring injury.  4/28: Patient was outside walking to his car when he slipped in the rain, left leg went forward and he did the splits. Felt a pull mid-proximal left hamstring with immediate pain. Difficulty sitting, walking now. Taking ibuprofen and using crutches. No prior injury.  5/5: Patient reports he is feeling much better. Some bruising posterior left upper leg. Still can't sit on left side. Walking better. Pain 0/10 currently. Using ACE wrap. Doing some basic ROM exercises (curls, lunges).  No past medical history on file.  Current Outpatient Prescriptions on File Prior to Visit  Medication Sig Dispense Refill  . fish oil-omega-3 fatty acids 1000 MG capsule Take 2 g by mouth daily.    Marland Kitchen glucosamine-chondroitin 500-400 MG tablet Take 1 tablet by mouth daily.     Marland Kitchen HYDROcodone-acetaminophen (NORCO) 5-325 MG per tablet Take 1 tablet by mouth every 6 (six) hours as needed for moderate pain. 40 tablet 0  . Multiple Vitamin (MULTIVITAMIN WITH MINERALS) TABS Take 1 tablet by mouth daily.    Marland Kitchen omeprazole (PRILOSEC) 20 MG capsule Take 1 capsule (20 mg total) by mouth daily. 15 capsule 0  . sucralfate (CARAFATE) 1 GM/10ML suspension Take 10 mLs (1 g total) by mouth 4 (four) times daily -  with meals and at bedtime. 420 mL 0  . zaleplon (SONATA) 5 MG capsule   1   No current facility-administered medications on file prior to visit.    Past Surgical History  Procedure Laterality Date  . Cholecystectomy      No Known Allergies  History   Social History  . Marital Status: Married    Spouse Name: N/A  . Number of Children: N/A  . Years of Education: N/A   Occupational History  . Not on file.   Social History Main Topics  . Smoking status: Never Smoker   . Smokeless tobacco: Never Used  . Alcohol Use: No  . Drug Use: Not on file  . Sexual Activity: Not on file   Other  Topics Concern  . Not on file   Social History Narrative    Family History  Problem Relation Age of Onset  . Sudden death Neg Hx   . Hypertension Neg Hx   . Heart attack Neg Hx   . Diabetes Neg Hx   . Hyperlipidemia Neg Hx     BP 134/84 mmHg  Pulse 74  Ht 5\' 9"  (1.753 m)  Wt 245 lb (111.131 kg)  BMI 36.16 kg/m2  Review of Systems: See HPI above.    Objective:  Physical Exam:  Gen: NAD  Left leg: No gross deformity, swelling, bruising, palpable defect. TTP mid-hamstring more medial than lateral.  FROM knee and hip. Pain on resisted knee flexion at 90 and 30 degrees - did not fully test strength. NVI distally.    Assessment & Plan:  1. Left hamstring strain - Switch to compression sleeve from ACE wrap.  Start strengthening now - can add ankle weight when tolerated.  Consider physical therapy if not improving.  F/u in 4 weeks or prn.

## 2015-05-09 IMAGING — CT CT ABD-PELV W/ CM
2 of 5 series · 16 of 46 positions shown, 18 images · IV contrast (APPLIED)
Comparison: None.

CLINICAL DATA: Abdominal pain and nausea

EXAM:
CT ABDOMEN AND PELVIS WITH CONTRAST
TECHNIQUE: Multidetector CT imaging of the abdomen and pelvis was performed
using the standard protocol following bolus administration of
intravenous contrast. Oral contrast was also administered.
CONTRAST:  100mL OMNIPAQUE IOHEXOL 300 MG/ML  SOLN

[Series 2: abd/pelvis 5.0 b31f · axial · 0.88mm/px · z∈[-506,-61]mm · 13 of 101 slices shown, 15 images]
[im 6/101  soft-tissue]
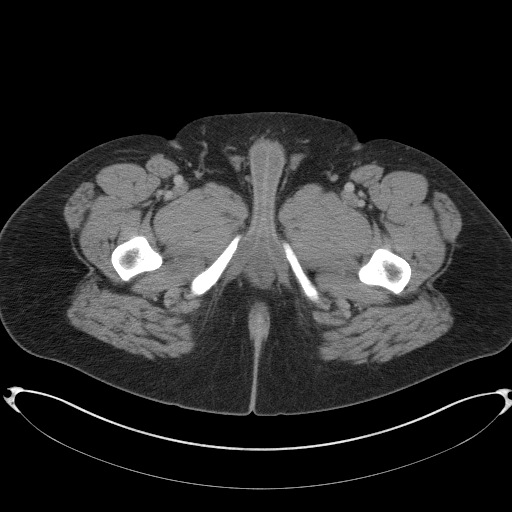
[im 6/101  bone]
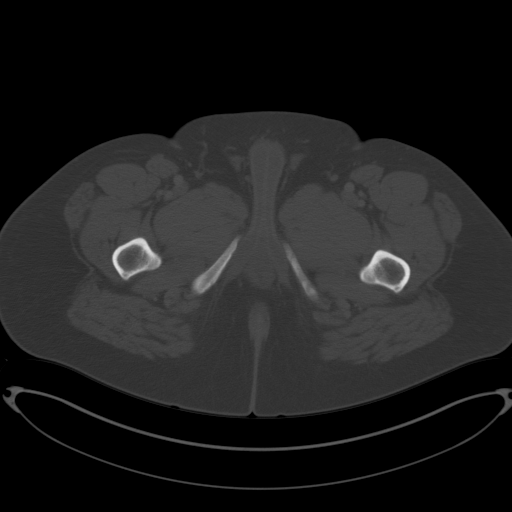
[im 12/101  soft-tissue]
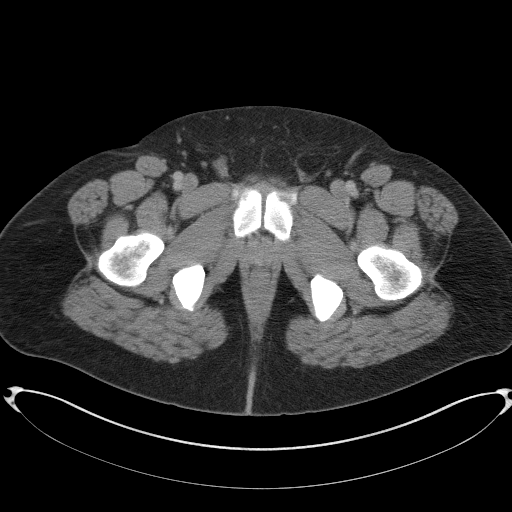
[im 23/101  soft-tissue]
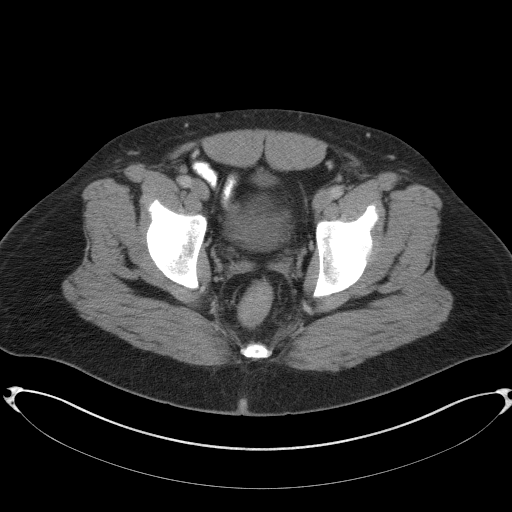
[im 28/101  soft-tissue]
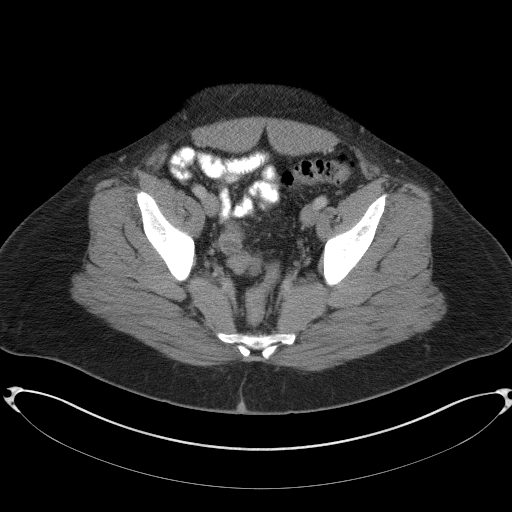
[im 34/101  soft-tissue]
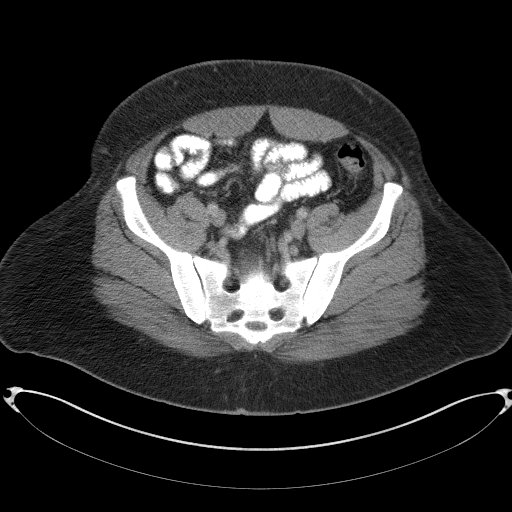
[im 45/101  soft-tissue]
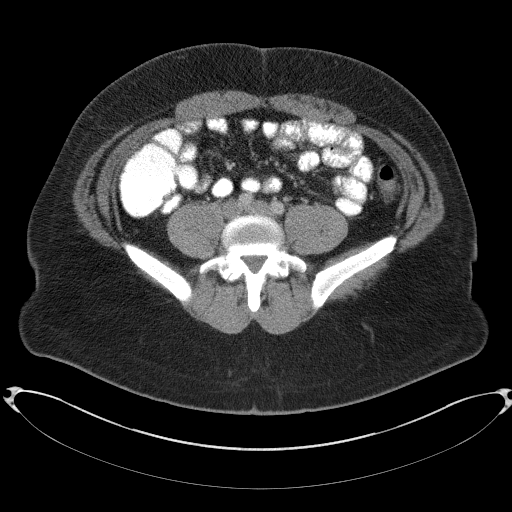
[im 51/101  soft-tissue]
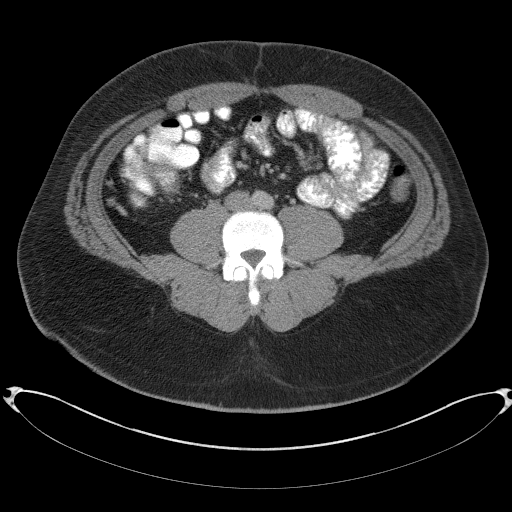
[im 56/101  soft-tissue]
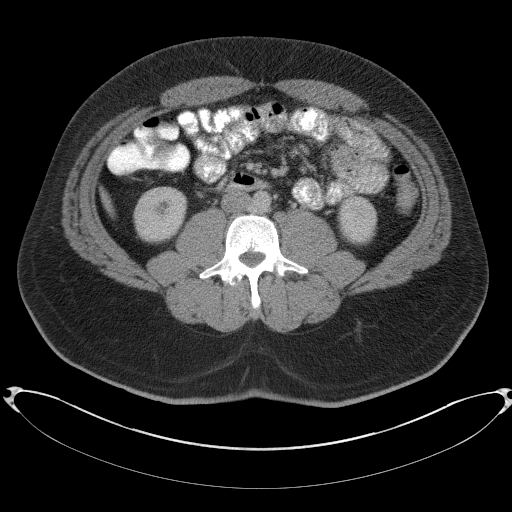
[im 67/101  soft-tissue]
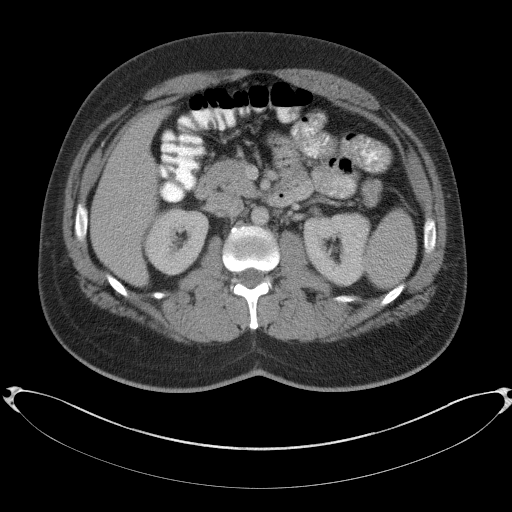
[im 67/101  bone]
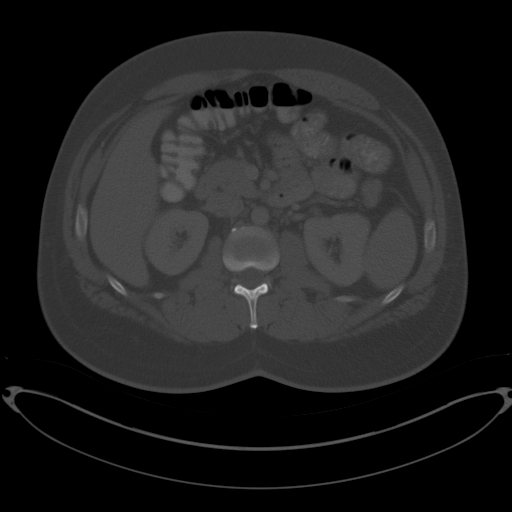
[im 73/101  soft-tissue]
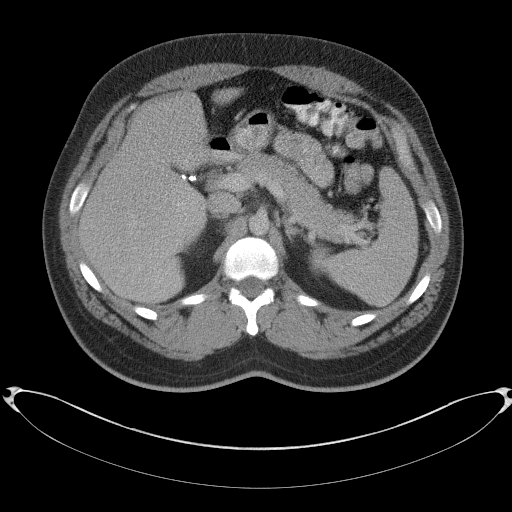
[im 78/101  soft-tissue]
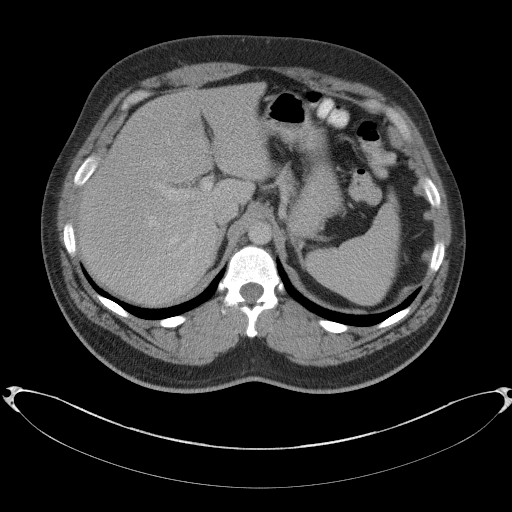
[im 89/101  soft-tissue]
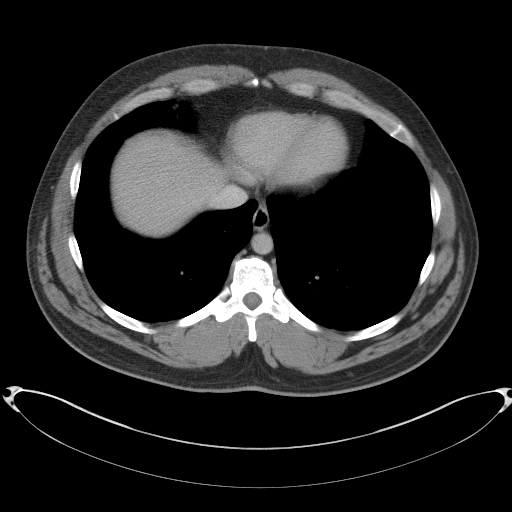
[im 95/101  soft-tissue]
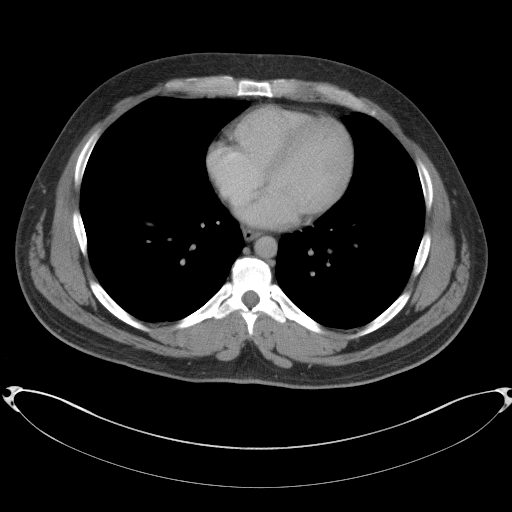

[Series 5: abd/pelvis 3.0 coronal · coronal · 0.94mm/px · 3 of 102 slices shown]
[im 34/102  soft-tissue]
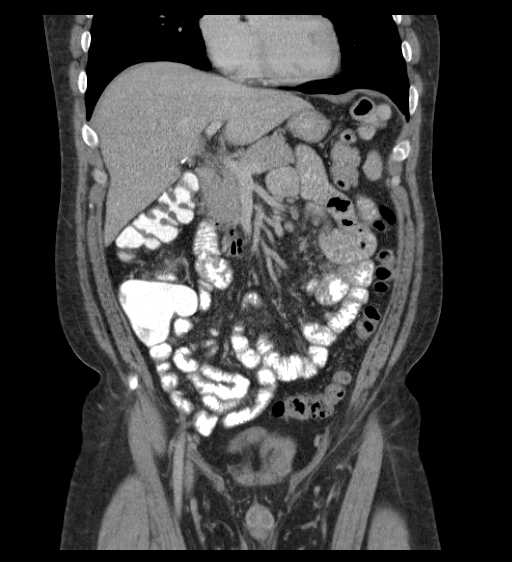
[im 45/102  soft-tissue]
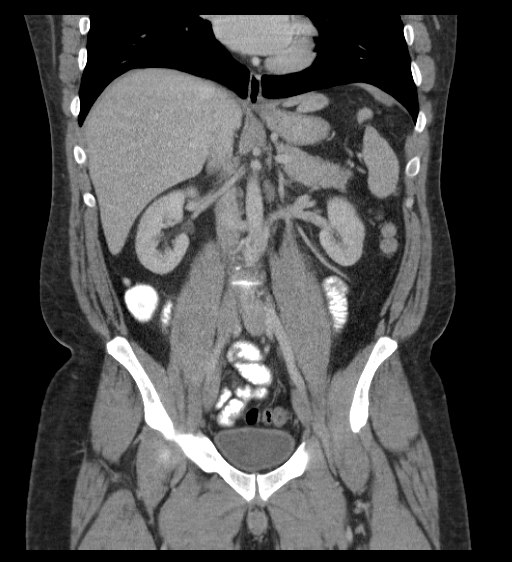
[im 57/102  soft-tissue]
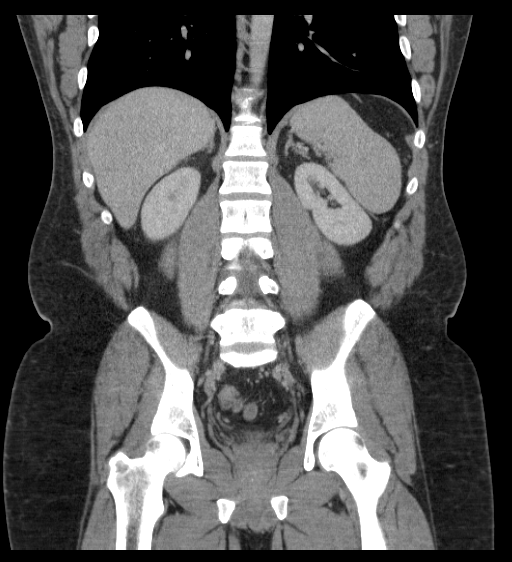

[16 of 46 positions shown; findings below may reference images not displayed]

FINDINGS: Lung bases are clear.

Liver is prominent measuring 18.2 cm in length. There is a focal 8
by 9 mm area of decreased attenuation in the posterior segment right
lobe of the liver which probably represents a small hemangioma. No
other focal liver lesions are identified. Gallbladder is absent.
There is no biliary duct dilatation.

Spleen, pancreas, and adrenals appear normal. Kidneys bilaterally
show no evidence of mass or hydronephrosis on either side. There is
no renal or ureteral calculus on either side. There is a retroaortic
left renal vein, an anatomic variant.

In the pelvis, the urinary bladder is midline with normal wall
thickness. There is fat in the left inguinal ring. There is no
pelvic mass or fluid collection. There are occasional sigmoid
diverticula without diverticulitis.

Appendix appears normal.  Terminal ileum appears normal.

There is no bowel obstruction. There is no free air or portal venous
air.

There is no ascites, adenopathy by size criteria, or abscess in the
abdomen or pelvis. There are scattered subcentimeter mesenteric
lymph nodes which must be regarded as nonspecific. There is no
appreciable abdominal aortic aneurysm. There are no blastic or lytic
bone lesions.
IMPRESSION: Occasional sigmoid diverticula without diverticulitis.

No abscess. Appendix appears normal. No mesenteric inflammation. No
bowel obstruction. There are scattered subcentimeter mesenteric
lymph nodes which are regarded as nonspecific. In the appropriate
clinical setting, a degree of mesenteric adenitis is possible. The
surrounding mesenteric does not appear thickened, however.

Liver prominent with probable small hemangioma in the posterior
segment right lobe region. Gallbladder is absent.

## 2019-08-20 ENCOUNTER — Other Ambulatory Visit: Payer: Self-pay

## 2019-08-20 ENCOUNTER — Ambulatory Visit (INDEPENDENT_AMBULATORY_CARE_PROVIDER_SITE_OTHER): Payer: No Typology Code available for payment source | Admitting: Family Medicine

## 2019-08-20 ENCOUNTER — Encounter: Payer: Self-pay | Admitting: Family Medicine

## 2019-08-20 VITALS — BP 122/84 | Ht 69.0 in | Wt 245.0 lb

## 2019-08-20 DIAGNOSIS — M545 Low back pain, unspecified: Secondary | ICD-10-CM

## 2019-08-20 NOTE — Progress Notes (Signed)
PCP: Jefm Petty, MD  Subjective:   HPI: Patient is a 44 y.o. male here for low back pain.  Patient denies acute injury. He reports over past month+ he's been running more for exercise. Noticed pain across low back since this time, worsening past few weeks. Feels like a pinch with tightness of low back. No radiation into legs. No numbness or tingling. No bowel/bladder dysfunction. Has tried some exercising on own. Worse with prolonged standing and with flexion.  History reviewed. No pertinent past medical history.  Current Outpatient Medications on File Prior to Visit  Medication Sig Dispense Refill  . fish oil-omega-3 fatty acids 1000 MG capsule Take 2 g by mouth daily.    Marland Kitchen glucosamine-chondroitin 500-400 MG tablet Take 1 tablet by mouth daily.     . Multiple Vitamin (MULTIVITAMIN WITH MINERALS) TABS Take 1 tablet by mouth daily.    . Turmeric POWD by Misc.(Non-Drug; Combo Route) route.    Marland Kitchen HYDROcodone-acetaminophen (NORCO) 5-325 MG per tablet Take 1 tablet by mouth every 6 (six) hours as needed for moderate pain. (Patient not taking: Reported on 08/20/2019) 40 tablet 0  . omeprazole (PRILOSEC) 20 MG capsule Take 1 capsule (20 mg total) by mouth daily. (Patient not taking: Reported on 08/20/2019) 15 capsule 0  . sucralfate (CARAFATE) 1 GM/10ML suspension Take 10 mLs (1 g total) by mouth 4 (four) times daily -  with meals and at bedtime. (Patient not taking: Reported on 08/20/2019) 420 mL 0  . zaleplon (SONATA) 5 MG capsule   1   No current facility-administered medications on file prior to visit.    Past Surgical History:  Procedure Laterality Date  . CHOLECYSTECTOMY      No Known Allergies  Social History   Socioeconomic History  . Marital status: Married    Spouse name: Not on file  . Number of children: Not on file  . Years of education: Not on file  . Highest education level: Not on file  Occupational History  . Not on file  Tobacco Use  . Smoking status: Never  Smoker  . Smokeless tobacco: Never Used  Substance and Sexual Activity  . Alcohol use: No    Alcohol/week: 0.0 standard drinks  . Drug use: Not on file  . Sexual activity: Not on file  Other Topics Concern  . Not on file  Social History Narrative  . Not on file   Social Determinants of Health   Financial Resource Strain:   . Difficulty of Paying Living Expenses: Not on file  Food Insecurity:   . Worried About Charity fundraiser in the Last Year: Not on file  . Ran Out of Food in the Last Year: Not on file  Transportation Needs:   . Lack of Transportation (Medical): Not on file  . Lack of Transportation (Non-Medical): Not on file  Physical Activity:   . Days of Exercise per Week: Not on file  . Minutes of Exercise per Session: Not on file  Stress:   . Feeling of Stress : Not on file  Social Connections:   . Frequency of Communication with Friends and Family: Not on file  . Frequency of Social Gatherings with Friends and Family: Not on file  . Attends Religious Services: Not on file  . Active Member of Clubs or Organizations: Not on file  . Attends Archivist Meetings: Not on file  . Marital Status: Not on file  Intimate Partner Violence:   . Fear of Current or  Ex-Partner: Not on file  . Emotionally Abused: Not on file  . Physically Abused: Not on file  . Sexually Abused: Not on file    Family History  Problem Relation Age of Onset  . Sudden death Neg Hx   . Hypertension Neg Hx   . Heart attack Neg Hx   . Diabetes Neg Hx   . Hyperlipidemia Neg Hx     BP 122/84   Ht 5\' 9"  (1.753 m)   Wt 245 lb (111.1 kg)   BMI 36.18 kg/m   Review of Systems: See HPI above.     Objective:  Physical Exam:  Gen: NAD, comfortable in exam room  Back: No gross deformity, scoliosis. No paraspinal TTP .  No midline or bony TTP. FROM with mild pain on extension. Strength LEs 5/5 all muscle groups.   2+ MSRs in patellar and achilles tendons, equal  bilaterally. Negative SLRs. Sensation intact to light touch bilaterally.  Bilateral hips: No deformity. FROM with 5/5 strength. No tenderness to palpation. NVI distally. Negative logroll bilateral hips Negative fabers and piriformis stretches.   Assessment & Plan:  1. Low back pain - consistent with flare of DDD, paraspinal muscle strain.  Ibuprofen or aleve, start PT for 2-3 visits and do home exercises on days he doesn't go to PT>  F/u in 6 weeks for reevaluation.

## 2019-08-20 NOTE — Patient Instructions (Signed)
Your back pain is due to paraspinal muscle strain and arthritis. Ok to take tylenol for baseline pain relief (1-2 extra strength tabs 3x/day) Ibuprofen or aleve for pain and inflammation - consider taking regularly for a week then as needed. Consider a muscle relaxant in the future if needed. Stay as active as possible within reason - avoid twisting maneuvers we discussed for now. Start physical therapy for 2-3 visits and do home exercises daily when you don't go to therapy. Follow up with me in 6 weeks for reevaluation.

## 2019-08-26 ENCOUNTER — Other Ambulatory Visit: Payer: Self-pay

## 2019-08-26 ENCOUNTER — Encounter: Payer: Self-pay | Admitting: Physical Therapy

## 2019-08-26 ENCOUNTER — Ambulatory Visit: Payer: No Typology Code available for payment source | Attending: Family Medicine | Admitting: Physical Therapy

## 2019-08-26 DIAGNOSIS — G8929 Other chronic pain: Secondary | ICD-10-CM | POA: Diagnosis present

## 2019-08-26 DIAGNOSIS — M545 Low back pain: Secondary | ICD-10-CM | POA: Insufficient documentation

## 2019-08-26 NOTE — Therapy (Addendum)
[facility] [address]  [place], [place], [address] Phone: [phone]   Fax:  [phone]  Physical Therapy Evaluation  Patient Details  Name: Stephen Osborn MRN: [id number] Date of Birth: Jul 24, 1975 Referring Provider (PT): Karlton Lemon, MD   Encounter Date: 08/26/2019  PT End of Session - 08/26/19 1746    Visit Number  1    Number of Visits  5    Date for PT Re-Evaluation  09/23/19    Authorization Type  [employer]    PT Start Time  1532    PT Stop Time  1611    PT Time Calculation (min)  39 min    Activity Tolerance  Patient tolerated treatment well    Behavior During Therapy  [facility] for tasks assessed/performed       History reviewed. No pertinent past medical history.  Past Surgical History:  Procedure Laterality Date  . CHOLECYSTECTOMY      There were no vitals filed for this visit.   Subjective Assessment - 08/26/19 1535    Subjective  Patient reports LBP off and on for the past 15 years. Recent flare has lasted for the past 6 weeks after increased mileage with running. Now, notes that he is having less intermittent and more constant pain. Worse with prolonged sitting and standing. Better with sitting on an exercise leaning over into flexion, walking up the stairs. Pain is located across B low back and B upper buttocks. Denies N/T.    Pertinent History  none    Limitations  Standing;Walking;House hold activities    How long can you sit comfortably?  unlimited    How long can you stand comfortably?  1-2 min    How long can you walk comfortably?  unlimited    Diagnostic tests  none    Patient Stated Goals  "find some stretches to help alleviate the pain"    Currently in Pain?  Yes    Pain Score  0-No pain    Pain Location  Back    Pain Orientation  Right;Left;Lower    Pain Descriptors / Indicators  Sharp;Tightness    Pain Type  Chronic pain;Acute pain    Pain Radiating Towards  around to B lateral hips          [facility] PT Assessment - 08/26/19 1541      Assessment   Medical Diagnosis  B LBP without sciatica, unspecified chronicity    Referring Provider (PT)  Karlton Lemon, MD    Onset Date/Surgical Date  07/15/19    Prior Therapy  yes      Precautions   Precautions  None      Balance Screen   Has the patient fallen in the past 6 months  No    Has the patient had a decrease in activity level because of a fear of falling?   No    Is the patient reluctant to leave their home because of a fear of falling?   No      Home Environment   Living Environment  Private residence    Living Arrangements  Spouse/significant other;Children    Available Help at Discharge  Family    Type of Mobile  Full time employment    Vocation Requirements  walking, stairs, computer work, driving    Leisure  running, gym  Cognition   Overall Cognitive Status  Within Functional Limits for tasks assessed      Sensation   Light Touch  Appears Intact      Coordination   Gross Motor Movements are Fluid and Coordinated  Yes      Posture/Postural Control   Posture/Postural Control  Postural limitations    Postural Limitations  Rounded Shoulders;Forward head      ROM / Strength   AROM / PROM / Strength  AROM;Strength      AROM   AROM Assessment Site  Lumbar    Lumbar Flexion  toes    Lumbar Extension  WNL    Lumbar - Right Side Bend  jt line    Lumbar - Left Side Bend  jt line    Lumbar - Right Rotation  WNL    Lumbar - Left Rotation  WNL      Strength   Strength Assessment Site  Hip;Knee;Ankle    Right/Left Hip  Right;Left    Right Hip Flexion  4+/5    Right Hip Extension  4+/5    Right Hip External Rotation   4+/5    Right Hip Internal Rotation  4/5    Right Hip ABduction  4+/5    Right Hip ADduction  4+/5    Left Hip Flexion  4+/5    Left Hip Extension  4+/5    Left Hip External Rotation  4+/5    Left Hip  Internal Rotation  4+/5    Left Hip ABduction  4+/5    Left Hip ADduction  4+/5    Right/Left Knee  Right;Left    Right Knee Flexion  4+/5    Right Knee Extension  4+/5    Left Knee Flexion  4+/5    Left Knee Extension  4+/5    Right/Left Ankle  Right;Left    Right Ankle Dorsiflexion  4+/5    Right Ankle Plantar Flexion  4+/5    Left Ankle Dorsiflexion  4+/5    Left Ankle Plantar Flexion  4+/5      Flexibility   Soft Tissue Assessment /Muscle Length  yes    Hamstrings  B WNL    Quadriceps  B hip flexors/quads mildly tight    Piriformis  B mildly tight with KTOS      Palpation   Spinal mobility  mild hypomobility L1    Palpation comment  tightness in B piriformis and proximal glutes, mild tenderness      Ambulation/Gait   Assistive device  None    Gait Pattern  Within Functional Limits;Step-through pattern    Ambulation Surface  Level;Indoor    Gait velocity  WNL                Objective measurements completed on examination: See above findings.              PT Education - 08/26/19 1746    Education Details  prognosis, HEP    Person(s) Educated  Patient    Methods  Explanation;Demonstration;Tactile cues;Verbal cues;Handout    Comprehension  Verbalized understanding;Returned demonstration       PT Short Term Goals - 08/26/19 1753      PT SHORT TERM GOAL #1   Title  Patient to be independent with initial HEP.    Time  2    Period  Weeks    Status  New    Target Date  09/09/19        PT Long Term  Goals - 08/26/19 1754      PT LONG TERM GOAL #1   Title  Patient to be independent with advanced HEP.    Time  4    Period  Weeks    Status  New    Target Date  09/23/19      PT LONG TERM GOAL #2   Title  Patient to demonstrate B hip flexor/quad and piriformis flexibilty.    Time  4    Period  Weeks    Status  New    Target Date  09/23/19      PT LONG TERM GOAL #3   Title  Patient to report tolerance for 1 hour of standing before onset of  pain.    Time  4    Period  Weeks    Status  New    Target Date  09/23/19      PT LONG TERM GOAL #4   Title  Patient to return to running up to 2 miles without limitations.    Time  4    Period  Weeks    Status  New    Target Date  09/23/19             Plan - 08/26/19 1747    Clinical Impression Statement  Patient is a 44y/o M presenting to OPPT with c/o acute on chronic LBP for the past 6 weeks. Recent flare likely caused by increased mileage with running. Pain is located across B low back and upper buttocks. Worse with prolonged sitting and standing, better with lumbar flexion. Patient today presenting with normal and nonpainful lumbar AROM, good overall B LE strength with only slight deficit in R hip IR, tightness in B hip flexors/quads and piriformis, and very slight tenderness over B piriformis and proximal glutes. Had difficulty reproducing patient's pain today, thus educated patient on HEP to address flexibility and posterior chain stretching which give patient relief. Patient reported understanding. Patient would benefit from skilled PT services 1x/week for 4 weeks as needed for back pain.    Personal Factors and Comorbidities  Age;Sex;Fitness;Past/Current Experience;Profession;Time since onset of injury/illness/exacerbation    Examination-Activity Limitations  Stand;Dressing;Transfers;Lift;Locomotion Level    Examination-Participation Restrictions  Church;Cleaning;Shop;Yard Work;Laundry;Meal Prep    Stability/Clinical Decision Making  Stable/Uncomplicated    Clinical Decision Making  Low    Rehab Potential  Good    PT Frequency  1x / week    PT Duration  4 weeks    PT Treatment/Interventions  ADLs/Self Care Home Management;Cryotherapy;Electrical Stimulation;Moist Heat;Traction;Therapeutic exercise;Therapeutic activities;Functional mobility training;Ultrasound;Gait training;Stair training;Neuromuscular re-education;Patient/family education;Manual techniques;Taping;Energy  conservation;Dry needling;Passive range of motion    PT Next Visit Plan  reassess HEP    Consulted and Agree with Plan of Care  Patient       Patient will benefit from skilled therapeutic intervention in order to improve the following deficits and impairments:  Hypomobility, Pain, Increased fascial restricitons, Decreased strength, Decreased activity tolerance, Increased muscle spasms, Improper body mechanics, Impaired flexibility  Visit Diagnosis: Chronic bilateral low back pain without sciatica     Problem List Patient Active Problem List   Diagnosis Date Noted  . Left hamstring muscle strain 11/16/2014  . Left knee pain 11/12/2014  . Left shoulder pain 11/12/2014  . Metatarsalgia of left foot 04/09/2013  . Metatarsal deformity 04/09/2013  . Right knee pain 09/24/2012  . Lower leg pain 12/21/2011     Janene Harvey, PT, DPT 08/26/19 6:01 PM   [facility] [address]  [place], [place], [address] Phone: [phone]   Fax:  [phone]  Name: Trason Shifflet MRN: [id number] Date of Birth: September 17, 1975   PHYSICAL THERAPY DISCHARGE SUMMARY  Visits from Start of Care: 1  Current functional level related to goals / functional outcomes: See above clinical impression; patient did not return    Remaining deficits: See above   Education / Equipment: HEP  Plan: Patient agrees to discharge.  Patient goals were not met. Patient is being discharged due to not returning since the last visit.  ?????     Janene Harvey, PT, DPT 10/08/19 2:26 PM

## 2019-10-01 ENCOUNTER — Ambulatory Visit (INDEPENDENT_AMBULATORY_CARE_PROVIDER_SITE_OTHER): Payer: No Typology Code available for payment source | Admitting: Family Medicine

## 2019-10-01 ENCOUNTER — Other Ambulatory Visit: Payer: Self-pay

## 2019-10-01 VITALS — BP 138/88 | Ht 69.5 in | Wt 240.0 lb

## 2019-10-01 DIAGNOSIS — M545 Low back pain, unspecified: Secondary | ICD-10-CM

## 2019-10-01 NOTE — Progress Notes (Signed)
PCP: Jefm Petty, MD  Subjective:   HPI: Patient is a 44 y.o. male here for 6 week follow of low back pain caused by arthritis/paraspinal strain.  Patient has improved since last visit and describes pain as now just being more uncomfortable. Following last visit, treatment was with home exercises/stretches and NSAIDS. Patient says he did not get much relief from NSAID medications but has had excellent relief from stretches. He has even been able to push himself at the gym with squats/deadlifts/medicine ball rotations. Pain has only re-emerged following a run and so has started stationary bike for cardio instead. Patient is happy with improvement and doesn't feel the need to pursue additional intervention.   No past medical history on file.  Current Outpatient Medications on File Prior to Visit  Medication Sig Dispense Refill  . fish oil-omega-3 fatty acids 1000 MG capsule Take 2 g by mouth daily.    Marland Kitchen glucosamine-chondroitin 500-400 MG tablet Take 1 tablet by mouth daily.     . Multiple Vitamin (MULTIVITAMIN WITH MINERALS) TABS Take 1 tablet by mouth daily.    . Turmeric POWD by Misc.(Non-Drug; Combo Route) route.    . zaleplon (SONATA) 5 MG capsule   1   No current facility-administered medications on file prior to visit.    Past Surgical History:  Procedure Laterality Date  . CHOLECYSTECTOMY      No Known Allergies  Social History   Socioeconomic History  . Marital status: Married    Spouse name: Not on file  . Number of children: Not on file  . Years of education: Not on file  . Highest education level: Not on file  Occupational History  . Not on file  Tobacco Use  . Smoking status: Never Smoker  . Smokeless tobacco: Never Used  Substance and Sexual Activity  . Alcohol use: No    Alcohol/week: 0.0 standard drinks  . Drug use: Not on file  . Sexual activity: Not on file  Other Topics Concern  . Not on file  Social History Narrative  . Not on file   Social  Determinants of Health   Financial Resource Strain:   . Difficulty of Paying Living Expenses:   Food Insecurity:   . Worried About Charity fundraiser in the Last Year:   . Arboriculturist in the Last Year:   Transportation Needs:   . Film/video editor (Medical):   Marland Kitchen Lack of Transportation (Non-Medical):   Physical Activity:   . Days of Exercise per Week:   . Minutes of Exercise per Session:   Stress:   . Feeling of Stress :   Social Connections:   . Frequency of Communication with Friends and Family:   . Frequency of Social Gatherings with Friends and Family:   . Attends Religious Services:   . Active Member of Clubs or Organizations:   . Attends Archivist Meetings:   Marland Kitchen Marital Status:   Intimate Partner Violence:   . Fear of Current or Ex-Partner:   . Emotionally Abused:   Marland Kitchen Physically Abused:   . Sexually Abused:     Family History  Problem Relation Age of Onset  . Sudden death Neg Hx   . Hypertension Neg Hx   . Heart attack Neg Hx   . Diabetes Neg Hx   . Hyperlipidemia Neg Hx     BP 138/88   Ht 5' 9.5" (1.765 m)   Wt 240 lb (108.9 kg)   BMI 34.93  kg/m   Review of Systems: See HPI above.     Objective:  Physical Exam:  General: Alert and oriented x3, NAD, comfortable in exam room HEENT: normocephalic and atraumatic, EOMI, conjunctivae and sclerae clear, MMM, no erythema or exudates, trachea midline,  Low Back Inspection: no swelling, no scarring, no overlying bruising or breaks in the skin, no obvious bony abnormalities Palpation: no tenderness of spinous processes, not able to reproduce pain on palpation of low back/lumbar spine, no paraspinal muscle spasm/tenderness ROM (active): complete ROM to flexion and extension,  Special Tests: negative straight leg raise, excellent hamstring flexibility Neurovascularity intact Strength 5/5 bilateral lower extremities Sensation intact over lower extremities Patellar/Achilles Reflexes brisk and  equal bilaterally   Assessment & Plan:   Stephen Osborn is a 44 y.o. male presenting for 6 week f/u of low back pain due to paraspinal muscle strain and arthritis. Symptoms resolving with stretching exercises and yoga with patient able to return to activity in the gym with only occasional uncomfortability and no pain on exam. Advised patient to continue stretching exercises/stretches for 4 more weeks and then reduce them to 3x per week. Patient may f/u  PRN if symptoms return.   Carolyne Littles MS4

## 2019-10-03 ENCOUNTER — Encounter: Payer: Self-pay | Admitting: Family Medicine

## 2020-04-12 ENCOUNTER — Telehealth: Payer: Self-pay | Admitting: General Practice

## 2020-04-12 NOTE — Telephone Encounter (Signed)
LVM to call back to schedule new patient appointment. 

## 2020-04-26 ENCOUNTER — Encounter: Payer: Self-pay | Admitting: Family Medicine

## 2020-04-26 ENCOUNTER — Other Ambulatory Visit: Payer: Self-pay

## 2020-04-26 ENCOUNTER — Ambulatory Visit (INDEPENDENT_AMBULATORY_CARE_PROVIDER_SITE_OTHER): Payer: No Typology Code available for payment source | Admitting: Family Medicine

## 2020-04-26 VITALS — BP 122/80 | HR 90 | Temp 97.4°F | Ht 69.0 in | Wt 259.4 lb

## 2020-04-26 DIAGNOSIS — F5101 Primary insomnia: Secondary | ICD-10-CM | POA: Diagnosis not present

## 2020-04-26 DIAGNOSIS — Z23 Encounter for immunization: Secondary | ICD-10-CM

## 2020-04-26 DIAGNOSIS — Z0000 Encounter for general adult medical examination without abnormal findings: Secondary | ICD-10-CM

## 2020-04-26 LAB — COMPREHENSIVE METABOLIC PANEL
ALT: 21 U/L (ref 0–53)
AST: 13 U/L (ref 0–37)
Albumin: 4.2 g/dL (ref 3.5–5.2)
Alkaline Phosphatase: 47 U/L (ref 39–117)
BUN: 13 mg/dL (ref 6–23)
CO2: 27 mEq/L (ref 19–32)
Calcium: 9.2 mg/dL (ref 8.4–10.5)
Chloride: 106 mEq/L (ref 96–112)
Creatinine, Ser: 1.05 mg/dL (ref 0.40–1.50)
GFR: 85.41 mL/min (ref >60.00)
Glucose, Bld: 100 mg/dL — ABNORMAL HIGH (ref 70–99)
Potassium: 4.5 mEq/L (ref 3.5–5.1)
Sodium: 139 mEq/L (ref 135–145)
Total Bilirubin: 1.5 mg/dL — ABNORMAL HIGH (ref 0.2–1.2)
Total Protein: 6.4 g/dL (ref 6.0–8.3)

## 2020-04-26 LAB — CBC
HCT: 45.6 % (ref 39.0–52.0)
Hemoglobin: 15.5 g/dL (ref 13.0–17.0)
MCHC: 34.1 g/dL (ref 30.0–36.0)
MCV: 86.6 fl (ref 78.0–100.0)
Platelets: 278 10*3/uL (ref 150.0–400.0)
RBC: 5.26 Mil/uL (ref 4.22–5.81)
RDW: 14.2 % (ref 11.5–15.5)
WBC: 6.5 10*3/uL (ref 4.0–10.5)

## 2020-04-26 LAB — LIPID PANEL
Cholesterol: 215 mg/dL — ABNORMAL HIGH (ref 0–200)
HDL: 77.5 mg/dL (ref >39.00)
LDL Cholesterol: 125 mg/dL — ABNORMAL HIGH (ref 0–99)
NonHDL: 137.45
Total CHOL/HDL Ratio: 3
Triglycerides: 61 mg/dL (ref 0.0–149.0)
VLDL: 12.2 mg/dL (ref 0.0–40.0)

## 2020-04-26 LAB — URINALYSIS, ROUTINE W REFLEX MICROSCOPIC
Bilirubin Urine: NEGATIVE
Hgb urine dipstick: NEGATIVE
Ketones, ur: NEGATIVE
Leukocytes,Ua: NEGATIVE
Nitrite: NEGATIVE
RBC / HPF: NONE SEEN (ref 0–?)
Specific Gravity, Urine: 1.02 (ref 1.000–1.030)
Total Protein, Urine: NEGATIVE
Urine Glucose: NEGATIVE
Urobilinogen, UA: 0.2 (ref 0.0–1.0)
pH: 6 (ref 5.0–8.0)

## 2020-04-26 MED ORDER — TRAZODONE HCL 50 MG PO TABS: 25.0000 mg | ORAL_TABLET | Freq: Every evening | ORAL | 3 refills | Status: DC | PRN

## 2020-04-26 NOTE — Progress Notes (Signed)
New Patient Office Visit  Subjective:  Patient ID: Stephen Osborn, male    DOB: 01-09-1976  Age: 44 y.o. MRN: [id number]  CC:  Chief Complaint  Patient presents with   Establish Care    New patient, no concerns.     HPI Stephen Osborn presents for establishment of care and a complete physical exam.  He is healthy as far as he knows.  Recently he has had a major lifestyle change.  He has been working out and eating healthy and been able to lose a good deal of weight.  He remains active working out 5 days weekly.  Longstanding history of insomnia.  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  He does it admit to anxiety associated with this.  His anxiety seems to do better when he sleeps. After weight loss underwent lipectomy.  Dealing with ongoing tinea cruris.  Currently under dermatology follow-up for this issue.  History reviewed. No pertinent past medical history.  Past Surgical History:  Procedure Laterality Date   CHOLECYSTECTOMY      Family History  Problem Relation Age of Onset   Healthy Mother    Stroke Father    Sudden death Neg Hx    Hypertension Neg Hx    Heart attack Neg Hx    Diabetes Neg Hx    Hyperlipidemia Neg Hx     Social History   Socioeconomic History   Marital status: Married    Spouse name: Not on file   Number of children: Not on file   Years of education: Not on file   Highest education level: Not on file  Occupational History   Not on file  Tobacco Use   Smoking status: Never Smoker   Smokeless tobacco: Never Used  Vaping Use   Vaping Use: Never used  Substance and Sexual Activity   Alcohol use: Yes    Alcohol/week: 0.0 standard drinks    Comment: occ   Drug use: Never   Sexual activity: Yes  Other Topics Concern   Not on file  Social History Narrative   Not on file   Social Determinants of Health   Financial Resource Strain:    Difficulty of Paying Living Expenses: Not on file    Food Insecurity:    Worried About [place] in the Last Year: Not on file   Ran Out of Food in the Last Year: Not on file  Transportation Needs:    Lack of Transportation (Medical): Not on file   Lack of Transportation (Non-Medical): Not on file  Physical Activity:    Days of Exercise per Week: Not on file   Minutes of Exercise per Session: Not on file  Stress:    Feeling of Stress : Not on file  Social Connections:    Frequency of Communication with Friends and Family: Not on file   Frequency of Social Gatherings with Friends and Family: Not on file   Attends Religious Services: Not on file   Active Member of Clubs or Organizations: Not on file   Attends Archivist Meetings: Not on file   Marital Status: Not on file  Intimate Partner Violence:    Fear of Current or Ex-Partner: Not on file   Emotionally Abused: Not on file   Physically Abused: Not on file   Sexually Abused: Not on file    ROS Review of Systems  Constitutional: Negative.   HENT: Negative.  Eyes: Negative for photophobia and visual disturbance.  Respiratory: Negative.   Cardiovascular: Negative.   Gastrointestinal: Negative.   Endocrine: Negative for polyphagia and polyuria.  Genitourinary: Negative.   Musculoskeletal: Negative for gait problem and joint swelling.  Skin: Positive for rash.  Allergic/Immunologic: Negative for immunocompromised state.  Neurological: Negative.   Hematological: Does not bruise/bleed easily.  Psychiatric/Behavioral: Positive for sleep disturbance. Negative for dysphoric mood.   Depression screen PHQ 2/9 04/26/2020  Decreased Interest 0  Down, Depressed, Hopeless 0  PHQ - 2 Score 0    Objective:   Today's Vitals: BP 122/80    Pulse 90    Temp (!) 97.4 F (36.3 C) (Tympanic)    Ht 5\' 9"  (1.753 m)    Wt 259 lb 6.4 oz (117.7 kg)    SpO2 95%    BMI 38.31 kg/m   Physical Exam Vitals and nursing note reviewed.  Constitutional:       General: He is not in acute distress.    Appearance: Normal appearance. He is not ill-appearing, toxic-appearing or diaphoretic.  HENT:     Head: Normocephalic and atraumatic.     Right Ear: Tympanic membrane, ear canal and external ear normal.     Left Ear: Tympanic membrane, ear canal and external ear normal.     Mouth/Throat:     Mouth: Mucous membranes are moist.     Pharynx: Oropharynx is clear. No oropharyngeal exudate or posterior oropharyngeal erythema.  Eyes:     General: No scleral icterus.       Right eye: No discharge.        Left eye: No discharge.     Extraocular Movements: Extraocular movements intact.     Conjunctiva/sclera: Conjunctivae normal.     Pupils: Pupils are equal, round, and reactive to light.  Cardiovascular:     Rate and Rhythm: Normal rate and regular rhythm.  Pulmonary:     Effort: Pulmonary effort is normal.     Breath sounds: Normal breath sounds.  Abdominal:     General: Abdomen is flat. Bowel sounds are normal. There is no distension.     Palpations: There is no mass.     Tenderness: There is no abdominal tenderness. There is no guarding or rebound.     Hernia: No hernia is present. There is no hernia in the left inguinal area or right inguinal area.  Genitourinary:    Penis: Normal and circumcised. No hypospadias, erythema, tenderness, discharge, swelling or lesions.      Testes:        Right: Mass, tenderness or swelling not present. Right testis is descended.        Left: Mass, tenderness or swelling not present. Left testis is descended.     Epididymis:     Right: Not inflamed or enlarged.     Left: Not inflamed or enlarged.  Musculoskeletal:     Cervical back: No rigidity or tenderness.  Lymphadenopathy:     Cervical: No cervical adenopathy.     Lower Body: No right inguinal adenopathy. No left inguinal adenopathy.  Skin:    General: Skin is warm and dry.       Neurological:     Mental Status: He is alert and oriented to person,  place, and time.  Psychiatric:        Mood and Affect: Mood normal.        Behavior: Behavior normal.     Assessment & Plan:   Problem List Items Addressed This  Visit      Other   Primary insomnia   Relevant Medications   traZODone (DESYREL) 50 MG tablet   Healthcare maintenance - Primary   Relevant Orders   CBC   Comprehensive metabolic panel   Lipid panel   Urinalysis, Routine w reflex microscopic    Other Visit Diagnoses    Need for influenza vaccination       Relevant Orders   Flu Vaccine QUAD 6+ mos PF IM (Fluarix Quad PF) (Completed)   Need for Tdap vaccination       Relevant Orders   Tdap vaccine greater than or equal to 7yo IM (Completed)      Outpatient Encounter Medications as of 04/26/2020  Medication Sig   fish oil-omega-3 fatty acids 1000 MG capsule Take 2 g by mouth daily.   fluticasone (CUTIVATE) 0.05 % cream APPLY TO AFFECTED AREA TWICE A DAY   glucosamine-chondroitin 500-400 MG tablet Take 1 tablet by mouth daily.    Multiple Vitamin (MULTIVITAMIN WITH MINERALS) TABS Take 1 tablet by mouth daily.   pimecrolimus (ELIDEL) 1 % cream APPLY TO AFFECTED AREA TWICE A DAY   Turmeric POWD by Misc.(Non-Drug; Combo Route) route.   clotrimazole (LOTRIMIN) 1 % cream APPLY TO AFFECTED AREA TWICE A DAY (Patient not taking: Reported on 04/26/2020)   traZODone (DESYREL) 50 MG tablet Take 0.5-1 tablets (25-50 mg total) by mouth at bedtime as needed for sleep.   zaleplon (SONATA) 5 MG capsule  (Patient not taking: Reported on 04/26/2020)   No facility-administered encounter medications on file as of 04/26/2020.    Follow-up: Return in about 1 year (around 04/26/2021), or if symptoms worsen or fail to improve.  Given information on health maintenance and disease prevention.  Care gaps in immunization record were reviewed.  Urged him to continue his healthy lifestyle.  We will give trazodone to try for sleep.  May consider Paxil.  Libby Maw, MD

## 2020-04-26 NOTE — Patient Instructions (Signed)
Health Maintenance, Male Adopting a healthy lifestyle and getting preventive care are important in promoting health and wellness. Ask your health care provider about:  The right schedule for you to have regular tests and exams.  Things you can do on your own to prevent diseases and keep yourself healthy. What should I know about diet, weight, and exercise? Eat a healthy diet   Eat a diet that includes plenty of vegetables, fruits, low-fat dairy products, and lean protein.  Do not eat a lot of foods that are high in solid fats, added sugars, or sodium. Maintain a healthy weight Body mass index (BMI) is a measurement that can be used to identify possible weight problems. It estimates body fat based on height and weight. Your health care provider can help determine your BMI and help you achieve or maintain a healthy weight. Get regular exercise Get regular exercise. This is one of the most important things you can do for your health. Most adults should:  Exercise for at least 150 minutes each week. The exercise should increase your heart rate and make you sweat (moderate-intensity exercise).  Do strengthening exercises at least twice a week. This is in addition to the moderate-intensity exercise.  Spend less time sitting. Even light physical activity can be beneficial. Watch cholesterol and blood lipids Have your blood tested for lipids and cholesterol at 45 years of age, then have this test every 5 years. You may need to have your cholesterol levels checked more often if:  Your lipid or cholesterol levels are high.  You are older than 45 years of age.  You are at high risk for heart disease. What should I know about cancer screening? Many types of cancers can be detected early and may often be prevented. Depending on your health history and family history, you may need to have cancer screening at various ages. This may include screening for:  Colorectal cancer.  Prostate  cancer.  Skin cancer.  Lung cancer. What should I know about heart disease, diabetes, and high blood pressure? Blood pressure and heart disease  High blood pressure causes heart disease and increases the risk of stroke. This is more likely to develop in people who have high blood pressure readings, are of African descent, or are overweight.  Talk with your health care provider about your target blood pressure readings.  Have your blood pressure checked: ? Every 3-5 years if you are 18-39 years of age. ? Every year if you are 40 years old or older.  If you are between the ages of 65 and 75 and are a current or former smoker, ask your health care provider if you should have a one-time screening for abdominal aortic aneurysm (AAA). Diabetes Have regular diabetes screenings. This checks your fasting blood sugar level. Have the screening done:  Once every three years after age 45 if you are at a normal weight and have a low risk for diabetes.  More often and at a younger age if you are overweight or have a high risk for diabetes. What should I know about preventing infection? Hepatitis B If you have a higher risk for hepatitis B, you should be screened for this virus. Talk with your health care provider to find out if you are at risk for hepatitis B infection. Hepatitis C Blood testing is recommended for:  Everyone born from 1945 through 1965.  Anyone with known risk factors for hepatitis C. Sexually transmitted infections (STIs)  You should be screened each year   for STIs, including gonorrhea and chlamydia, if: ? You are sexually active and are younger than 45 years of age. ? You are older than 45 years of age and your health care provider tells you that you are at risk for this type of infection. ? Your sexual activity has changed since you were last screened, and you are at increased risk for chlamydia or gonorrhea. Ask your health care provider if you are at risk.  Ask your  health care provider about whether you are at high risk for HIV. Your health care provider may recommend a prescription medicine to help prevent HIV infection. If you choose to take medicine to prevent HIV, you should first get tested for HIV. You should then be tested every 3 months for as long as you are taking the medicine. Follow these instructions at home: Lifestyle  Do not use any products that contain nicotine or tobacco, such as cigarettes, e-cigarettes, and chewing tobacco. If you need help quitting, ask your health care provider.  Do not use street drugs.  Do not share needles.  Ask your health care provider for help if you need support or information about quitting drugs. Alcohol use  Do not drink alcohol if your health care provider tells you not to drink.  If you drink alcohol: ? Limit how much you have to 0-2 drinks a day. ? Be aware of how much alcohol is in your drink. In the [place] one drink equals one 12 oz bottle of beer (355 mL), one 5 oz glass of wine (148 mL), or one 1 oz glass of hard liquor (44 mL). General instructions  Schedule regular health, dental, and eye exams.  Stay current with your vaccines.  Tell your health care provider if: ? You often feel depressed. ? You have ever been abused or do not feel safe at home. Summary  Adopting a healthy lifestyle and getting preventive care are important in promoting health and wellness.  Follow your health care provider's instructions about healthy diet, exercising, and getting tested or screened for diseases.  Follow your health care provider's instructions on monitoring your cholesterol and blood pressure. This information is not intended to replace advice given to you by your health care provider. Make sure you discuss any questions you have with your health care provider. Document Revised: 06/26/2018 Document Reviewed: 06/26/2018 Elsevier Patient Education  [place].  Insomnia Insomnia is a  sleep disorder that makes it difficult to fall asleep or stay asleep. Insomnia can cause fatigue, low energy, difficulty concentrating, mood swings, and poor performance at work or school. There are three different ways to classify insomnia:  Difficulty falling asleep.  Difficulty staying asleep.  Waking up too early in the morning. Any type of insomnia can be long-term (chronic) or short-term (acute). Both are common. Short-term insomnia usually lasts for three months or less. Chronic insomnia occurs at least three times a week for longer than three months. What are the causes? Insomnia may be caused by another condition, situation, or substance, such as:  Anxiety.  Certain medicines.  Gastroesophageal reflux disease (GERD) or other gastrointestinal conditions.  Asthma or other breathing conditions.  Restless legs syndrome, sleep apnea, or other sleep disorders.  Chronic pain.  Menopause.  Stroke.  Abuse of alcohol, tobacco, or illegal drugs.  Mental health conditions, such as depression.  Caffeine.  Neurological disorders, such as Alzheimer's disease.  An overactive thyroid (hyperthyroidism). Sometimes, the cause of insomnia may not be known. What increases the  risk? Risk factors for insomnia include:  Gender. Women are affected more often than men.  Age. Insomnia is more common as you get older.  Stress.  Lack of exercise.  Irregular work schedule or working night shifts.  Traveling between different time zones.  Certain medical and mental health conditions. What are the signs or symptoms? If you have insomnia, the main symptom is having trouble falling asleep or having trouble staying asleep. This may lead to other symptoms, such as:  Feeling fatigued or having low energy.  Feeling nervous about going to sleep.  Not feeling rested in the morning.  Having trouble concentrating.  Feeling irritable, anxious, or depressed. How is this diagnosed? This  condition may be diagnosed based on:  Your symptoms and medical history. Your health care provider may ask about: ? Your sleep habits. ? Any medical conditions you have. ? Your mental health.  A physical exam. How is this treated? Treatment for insomnia depends on the cause. Treatment may focus on treating an underlying condition that is causing insomnia. Treatment may also include:  Medicines to help you sleep.  Counseling or therapy.  Lifestyle adjustments to help you sleep better. Follow these instructions at home: Eating and drinking   Limit or avoid alcohol, caffeinated beverages, and cigarettes, especially close to bedtime. These can disrupt your sleep.  Do not eat a large meal or eat spicy foods right before bedtime. This can lead to digestive discomfort that can make it hard for you to sleep. Sleep habits   Keep a sleep diary to help you and your health care provider figure out what could be causing your insomnia. Write down: ? When you sleep. ? When you wake up during the night. ? How well you sleep. ? How rested you feel the next day. ? Any side effects of medicines you are taking. ? What you eat and drink.  Make your bedroom a dark, comfortable place where it is easy to fall asleep. ? Put up shades or blackout curtains to block light from outside. ? Use a white noise machine to block noise. ? Keep the temperature cool.  Limit screen use before bedtime. This includes: ? Watching TV. ? Using your smartphone, tablet, or computer.  Stick to a routine that includes going to bed and waking up at the same times every day and night. This can help you fall asleep faster. Consider making a quiet activity, such as reading, part of your nighttime routine.  Try to avoid taking naps during the day so that you sleep better at night.  Get out of bed if you are still awake after 15 minutes of trying to sleep. Keep the lights down, but try reading or doing a quiet activity.  When you feel sleepy, go back to bed. General instructions  Take over-the-counter and prescription medicines only as told by your health care provider.  Exercise regularly, as told by your health care provider. Avoid exercise starting several hours before bedtime.  Use relaxation techniques to manage stress. Ask your health care provider to suggest some techniques that may work well for you. These may include: ? Breathing exercises. ? Routines to release muscle tension. ? Visualizing peaceful scenes.  Make sure that you drive carefully. Avoid driving if you feel very sleepy.  Keep all follow-up visits as told by your health care provider. This is important. Contact a health care provider if:  You are tired throughout the day.  You have trouble in your daily routine due  to sleepiness.  You continue to have sleep problems, or your sleep problems get worse. Get help right away if:  You have serious thoughts about hurting yourself or someone else. If you ever feel like you may hurt yourself or others, or have thoughts about taking your own life, get help right away. You can go to your nearest emergency department or call:  Your local emergency services (911 in the [place]  A suicide crisis helpline, such as the [facility] at (804)793-2775. This is open 24 hours a day. Summary  Insomnia is a sleep disorder that makes it difficult to fall asleep or stay asleep.  Insomnia can be long-term (chronic) or short-term (acute).  Treatment for insomnia depends on the cause. Treatment may focus on treating an underlying condition that is causing insomnia.  Keep a sleep diary to help you and your health care provider figure out what could be causing your insomnia. This information is not intended to replace advice given to you by your health care provider. Make sure you discuss any questions you have with your health care provider. Document Revised: 06/15/2017  Document Reviewed: 04/12/2017 Elsevier Patient Education  2020 [employer]  Preventive Care 35-59 Years Old, Male Preventive care refers to lifestyle choices and visits with your health care provider that can promote health and wellness. This includes:  A yearly physical exam. This is also called an annual well check.  Regular dental and eye exams.  Immunizations.  Screening for certain conditions.  Healthy lifestyle choices, such as eating a healthy diet, getting regular exercise, not using drugs or products that contain nicotine and tobacco, and limiting alcohol use. What can I expect for my preventive care visit? Physical exam Your health care provider will check:  Height and weight. These may be used to calculate body mass index (BMI), which is a measurement that tells if you are at a healthy weight.  Heart rate and blood pressure.  Your skin for abnormal spots. Counseling Your health care provider may ask you questions about:  Alcohol, tobacco, and drug use.  Emotional well-being.  Home and relationship well-being.  Sexual activity.  Eating habits.  Work and work Statistician. What immunizations do I need?  Influenza (flu) vaccine  This is recommended every year. Tetanus, diphtheria, and pertussis (Tdap) vaccine  You may need a Td booster every 10 years. Varicella (chickenpox) vaccine  You may need this vaccine if you have not already been vaccinated. Zoster (shingles) vaccine  You may need this after age 15. Measles, mumps, and rubella (MMR) vaccine  You may need at least one dose of MMR if you were born in 1957 or later. You may also need a second dose. Pneumococcal conjugate (PCV13) vaccine  You may need this if you have certain conditions and were not previously vaccinated. Pneumococcal polysaccharide (PPSV23) vaccine  You may need one or two doses if you smoke cigarettes or if you have certain conditions. Meningococcal conjugate (MenACWY)  vaccine  You may need this if you have certain conditions. Hepatitis A vaccine  You may need this if you have certain conditions or if you travel or work in places where you may be exposed to hepatitis A. Hepatitis B vaccine  You may need this if you have certain conditions or if you travel or work in places where you may be exposed to hepatitis B. Haemophilus influenzae type b (Hib) vaccine  You may need this if you have certain risk factors. Human papillomavirus (HPV) vaccine  If recommended by your health care provider, you may need three doses over 6 months. You may receive vaccines as individual doses or as more than one vaccine together in one shot (combination vaccines). Talk with your health care provider about the risks and benefits of combination vaccines. What tests do I need? Blood tests  Lipid and cholesterol levels. These may be checked every 5 years, or more frequently if you are over 55 years old.  Hepatitis C test.  Hepatitis B test. Screening  Lung cancer screening. You may have this screening every year starting at age 17 if you have a 30-pack-year history of smoking and currently smoke or have quit within the past 15 years.  Prostate cancer screening. Recommendations will vary depending on your family history and other risks.  Colorectal cancer screening. All adults should have this screening starting at age 59 and continuing until age 37. Your health care provider may recommend screening at age 62 if you are at increased risk. You will have tests every 1-10 years, depending on your results and the type of screening test.  Diabetes screening. This is done by checking your blood sugar (glucose) after you have not eaten for a while (fasting). You may have this done every 1-3 years.  Sexually transmitted disease (STD) testing. Follow these instructions at home: Eating and drinking  Eat a diet that includes fresh fruits and vegetables, whole grains, lean protein,  and low-fat dairy products.  Take vitamin and mineral supplements as recommended by your health care provider.  Do not drink alcohol if your health care provider tells you not to drink.  If you drink alcohol: ? Limit how much you have to 0-2 drinks a day. ? Be aware of how much alcohol is in your drink. In the [place] one drink equals one 12 oz bottle of beer (355 mL), one 5 oz glass of wine (148 mL), or one 1 oz glass of hard liquor (44 mL). Lifestyle  Take daily care of your teeth and gums.  Stay active. Exercise for at least 30 minutes on 5 or more days each week.  Do not use any products that contain nicotine or tobacco, such as cigarettes, e-cigarettes, and chewing tobacco. If you need help quitting, ask your health care provider.  If you are sexually active, practice safe sex. Use a condom or other form of protection to prevent STIs (sexually transmitted infections).  Talk with your health care provider about taking a low-dose aspirin every day starting at age 33. What's next?  Go to your health care provider once a year for a well check visit.  Ask your health care provider how often you should have your eyes and teeth checked.  Stay up to date on all vaccines. This information is not intended to replace advice given to you by your health care provider. Make sure you discuss any questions you have with your health care provider. Document Revised: 06/27/2018 Document Reviewed: 06/27/2018 Elsevier Patient Education  2020 [employer].

## 2020-05-05 ENCOUNTER — Encounter: Payer: Self-pay | Admitting: Family Medicine

## 2020-06-02 ENCOUNTER — Ambulatory Visit: Payer: No Typology Code available for payment source | Admitting: Family Medicine

## 2020-06-04 ENCOUNTER — Other Ambulatory Visit: Payer: Self-pay | Admitting: Family Medicine

## 2020-06-04 DIAGNOSIS — F5101 Primary insomnia: Secondary | ICD-10-CM

## 2020-06-04 NOTE — Telephone Encounter (Signed)
Last OV 04/26/20 Last fill 04/26/20  330/3

## 2021-04-27 ENCOUNTER — Other Ambulatory Visit: Payer: Self-pay

## 2021-04-27 ENCOUNTER — Encounter: Payer: Self-pay | Admitting: Family Medicine

## 2021-04-27 ENCOUNTER — Ambulatory Visit (INDEPENDENT_AMBULATORY_CARE_PROVIDER_SITE_OTHER): Payer: No Typology Code available for payment source | Admitting: Family Medicine

## 2021-04-27 VITALS — BP 116/76 | HR 81 | Temp 96.8°F | Ht 69.0 in | Wt 270.0 lb

## 2021-04-27 DIAGNOSIS — F5101 Primary insomnia: Secondary | ICD-10-CM

## 2021-04-27 DIAGNOSIS — D229 Melanocytic nevi, unspecified: Secondary | ICD-10-CM

## 2021-04-27 DIAGNOSIS — Z23 Encounter for immunization: Secondary | ICD-10-CM | POA: Diagnosis not present

## 2021-04-27 DIAGNOSIS — B356 Tinea cruris: Secondary | ICD-10-CM

## 2021-04-27 DIAGNOSIS — Z0001 Encounter for general adult medical examination with abnormal findings: Secondary | ICD-10-CM

## 2021-04-27 DIAGNOSIS — Z0000 Encounter for general adult medical examination without abnormal findings: Secondary | ICD-10-CM

## 2021-04-27 LAB — CBC
HCT: 46 % (ref 39.0–52.0)
Hemoglobin: 15.9 g/dL (ref 13.0–17.0)
MCHC: 34.6 g/dL (ref 30.0–36.0)
MCV: 85.8 fl (ref 78.0–100.0)
Platelets: 263 10*3/uL (ref 150.0–400.0)
RBC: 5.35 Mil/uL (ref 4.22–5.81)
RDW: 13.6 % (ref 11.5–15.5)
WBC: 6.3 10*3/uL (ref 4.0–10.5)

## 2021-04-27 LAB — COMPREHENSIVE METABOLIC PANEL
ALT: 27 U/L (ref 0–53)
AST: 19 U/L (ref 0–37)
Albumin: 4.3 g/dL (ref 3.5–5.2)
Alkaline Phosphatase: 54 U/L (ref 39–117)
BUN: 18 mg/dL (ref 6–23)
CO2: 26 mEq/L (ref 19–32)
Calcium: 9.4 mg/dL (ref 8.4–10.5)
Chloride: 104 mEq/L (ref 96–112)
Creatinine, Ser: 1.04 mg/dL (ref 0.40–1.50)
GFR: 86.52 mL/min (ref >60.00)
Glucose, Bld: 103 mg/dL — ABNORMAL HIGH (ref 70–99)
Potassium: 4.1 mEq/L (ref 3.5–5.1)
Sodium: 138 mEq/L (ref 135–145)
Total Bilirubin: 1.7 mg/dL — ABNORMAL HIGH (ref 0.2–1.2)
Total Protein: 6.4 g/dL (ref 6.0–8.3)

## 2021-04-27 LAB — HEMOGLOBIN A1C: Hgb A1c MFr Bld: 5.4 % (ref 4.6–6.5)

## 2021-04-27 LAB — TSH: TSH: 1.85 u[IU]/mL (ref 0.35–5.50)

## 2021-04-27 LAB — URINALYSIS, ROUTINE W REFLEX MICROSCOPIC
Bilirubin Urine: NEGATIVE
Hgb urine dipstick: NEGATIVE
Ketones, ur: NEGATIVE
Leukocytes,Ua: NEGATIVE
Nitrite: NEGATIVE
RBC / HPF: NONE SEEN (ref 0–?)
Specific Gravity, Urine: 1.02 (ref 1.000–1.030)
Total Protein, Urine: NEGATIVE
Urine Glucose: NEGATIVE
Urobilinogen, UA: 0.2 (ref 0.0–1.0)
pH: 6 (ref 5.0–8.0)

## 2021-04-27 LAB — LIPID PANEL
Cholesterol: 187 mg/dL (ref 0–200)
HDL: 74.1 mg/dL (ref >39.00)
LDL Cholesterol: 94 mg/dL (ref 0–99)
NonHDL: 113.2
Total CHOL/HDL Ratio: 3
Triglycerides: 94 mg/dL (ref 0.0–149.0)
VLDL: 18.8 mg/dL (ref 0.0–40.0)

## 2021-04-27 NOTE — Progress Notes (Addendum)
Established Patient Office Visit  Subjective:  Patient ID: Stephen Osborn, male    DOB: 1976/05/20  Age: 45 y.o. MRN: [id number]  CC:  Chief Complaint  Patient presents with   Annual Exam    CPE. No main concerns    HPI Stephen Osborn presents for his yearly physical.  He has been doing well.  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  Continues to exercise and workout.  Uses Sonata for sleep and trazodone as needed.  Mom is healthy dad had a stroke at age 54 that left him with hemiparesis.  He is now 89 and in a long-term care facility.  History reviewed. No pertinent past medical history.  Past Surgical History:  Procedure Laterality Date   CHOLECYSTECTOMY     LIPECTOMY      Family History  Problem Relation Age of Onset   Healthy Mother    Stroke Father    Sudden death Neg Hx    Hypertension Neg Hx    Heart attack Neg Hx    Diabetes Neg Hx    Hyperlipidemia Neg Hx     Social History   Socioeconomic History   Marital status: Married    Spouse name: Not on file   Number of children: Not on file   Years of education: Not on file   Highest education level: Not on file  Occupational History   Not on file  Tobacco Use   Smoking status: Never   Smokeless tobacco: Never  Vaping Use   Vaping Use: Never used  Substance and Sexual Activity   Alcohol use: Yes    Alcohol/week: 0.0 standard drinks    Comment: occ   Drug use: Never   Sexual activity: Yes  Other Topics Concern   Not on file  Social History Narrative   Not on file   Social Determinants of Health   Financial Resource Strain: Not on file  Food Insecurity: Not on file  Transportation Needs: Not on file  Physical Activity: Not on file  Stress: Not on file  Social Connections: Not on file  Intimate Partner Violence: Not on file    Outpatient Medications Prior to Visit  Medication Sig Dispense Refill   Dietary Management Product (NEOKE BCAA4) POWD Take by  mouth.     fish oil-omega-3 fatty acids 1000 MG capsule Take 2 g by mouth daily.     fluticasone (CUTIVATE) 0.05 % cream APPLY TO AFFECTED AREA TWICE A DAY     glucosamine-chondroitin 500-400 MG tablet Take 1 tablet by mouth daily.      Multiple Vitamin (MULTIVITAMIN WITH MINERALS) TABS Take 1 tablet by mouth daily.     NON FORMULARY 1,200 mg. Caralluma Fimbriata     Psyllium (METAMUCIL PO) Take by mouth.     traZODone (DESYREL) 50 MG tablet TAKE 0.5-1 TABLETS BY MOUTH AT BEDTIME AS NEEDED FOR SLEEP. 90 tablet 2   Turmeric POWD by Misc.(Non-Drug; Combo Route) route.     zaleplon (SONATA) 5 MG capsule   1   clotrimazole (LOTRIMIN) 1 % cream APPLY TO AFFECTED AREA TWICE A DAY (Patient not taking: No sig reported)     pimecrolimus (ELIDEL) 1 % cream APPLY TO AFFECTED AREA TWICE A DAY (Patient not taking: Reported on 04/27/2021)     No facility-administered medications prior to visit.    No Known Allergies  ROS Review of Systems  Constitutional:  Negative for diaphoresis, fatigue, fever and unexpected  weight change.  HENT: Negative.    Eyes:  Negative for photophobia and visual disturbance.  Respiratory: Negative.    Cardiovascular: Negative.   Gastrointestinal: Negative.  Negative for anal bleeding, blood in stool, constipation and diarrhea.  Endocrine: Negative for polyphagia and polyuria.  Genitourinary:  Negative for difficulty urinating, frequency and urgency.  Musculoskeletal:  Negative for gait problem and joint swelling.  Skin:  Positive for rash.  Neurological:  Negative for speech difficulty and weakness.  Psychiatric/Behavioral:  Positive for sleep disturbance. Negative for dysphoric mood. The patient is not nervous/anxious.      Objective:    Physical Exam Vitals and nursing note reviewed.  Constitutional:      General: He is not in acute distress.    Appearance: Normal appearance. He is obese. He is not ill-appearing, toxic-appearing or diaphoretic.  HENT:     Head:  Normocephalic and atraumatic.     Right Ear: Tympanic membrane, ear canal and external ear normal.     Left Ear: Tympanic membrane, ear canal and external ear normal.     Mouth/Throat:     Mouth: Mucous membranes are moist.     Pharynx: Oropharynx is clear. No oropharyngeal exudate or posterior oropharyngeal erythema.  Eyes:     General: No scleral icterus.       Right eye: No discharge.        Left eye: No discharge.     Extraocular Movements: Extraocular movements intact.     Conjunctiva/sclera: Conjunctivae normal.     Pupils: Pupils are equal, round, and reactive to light.  Neck:     Vascular: No carotid bruit.  Cardiovascular:     Rate and Rhythm: Normal rate and regular rhythm.  Pulmonary:     Effort: Pulmonary effort is normal.     Breath sounds: Normal breath sounds.  Abdominal:     General: Abdomen is flat.     Palpations: Abdomen is soft.     Hernia: There is no hernia in the left inguinal area or right inguinal area.  Genitourinary:    Penis: Circumcised. No hypospadias, erythema, tenderness, discharge, swelling or lesions.      Testes:        Right: Mass, tenderness or swelling not present. Right testis is descended.        Left: Mass, tenderness or swelling not present. Left testis is descended.     Epididymis:     Right: Not inflamed or enlarged.     Left: Not inflamed or enlarged.  Musculoskeletal:     Cervical back: No rigidity or tenderness.  Lymphadenopathy:     Cervical: No cervical adenopathy.     Lower Body: No right inguinal adenopathy. No left inguinal adenopathy.  Skin:    General: Skin is warm and dry.       Neurological:     Mental Status: He is alert and oriented to person, place, and time.  Psychiatric:        Mood and Affect: Mood normal.        Behavior: Behavior normal.    BP 116/76 (BP Location: Right Arm, Patient Position: Sitting, Cuff Size: Large)   Pulse 81   Temp (!) 96.8 F (36 C) (Temporal)   Ht 5\' 9"  (1.753 m)   Wt 270 lb  (122.5 kg)   SpO2 97%   BMI 39.87 kg/m  Wt Readings from Last 3 Encounters:  04/27/21 270 lb (122.5 kg)  04/26/20 259 lb 6.4 oz (117.7 kg)  10/01/19  240 lb (108.9 kg)     Health Maintenance Due  Topic Date Due   HIV Screening  Never done   Hepatitis C Screening  Never done   COVID-19 Vaccine (3 - Pfizer risk series) 11/26/2019   COLONOSCOPY (Pts 45-15yrs Insurance coverage will need to be confirmed)  Never done    There are no preventive care reminders to display for this patient.  Lab Results  Component Value Date   TSH 1.85 04/27/2021   Lab Results  Component Value Date   WBC 6.3 04/27/2021   HGB 15.9 04/27/2021   HCT 46.0 04/27/2021   MCV 85.8 04/27/2021   PLT 263.0 04/27/2021   Lab Results  Component Value Date   NA 138 04/27/2021   K 4.1 04/27/2021   CO2 26 04/27/2021   GLUCOSE 103 (H) 04/27/2021   BUN 18 04/27/2021   CREATININE 1.04 04/27/2021   BILITOT 1.7 (H) 04/27/2021   ALKPHOS 54 04/27/2021   AST 19 04/27/2021   ALT 27 04/27/2021   PROT 6.4 04/27/2021   ALBUMIN 4.3 04/27/2021   CALCIUM 9.4 04/27/2021   ANIONGAP 13 03/25/2014   GFR 86.52 04/27/2021   Lab Results  Component Value Date   CHOL 187 04/27/2021   Lab Results  Component Value Date   HDL 74.10 04/27/2021   Lab Results  Component Value Date   LDLCALC 94 04/27/2021   Lab Results  Component Value Date   TRIG 94.0 04/27/2021   Lab Results  Component Value Date   CHOLHDL 3 04/27/2021   Lab Results  Component Value Date   HGBA1C 5.4 04/27/2021      Assessment & Plan:   Problem List Items Addressed This Visit       Musculoskeletal and Integument   Tinea cruris   Relevant Orders   Ambulatory referral to Dermatology   Multiple atypical nevi   Relevant Orders   Ambulatory referral to Dermatology     Other   Primary insomnia - Primary   Relevant Orders   TSH (Completed)   Healthcare maintenance   Relevant Orders   CBC (Completed)   Comprehensive metabolic panel  (Completed)   Lipid panel (Completed)   Hemoglobin A1c (Completed)   Urinalysis, Routine w reflex microscopic (Completed)   Ambulatory referral to Gastroenterology   Need for influenza vaccination   Relevant Orders   Flu Vaccine QUAD 6+ mos PF IM (Fluarix Quad PF) (Completed)   Morbid obesity ([place])    No orders of the defined types were placed in this encounter.   Follow-up: Return in about 1 year (around 04/27/2022), or if symptoms worsen or fail to improve.  Given information on health maintenance and disease prevention.  Agrees to have his flu shot today and go for his first colonoscopy consultation.  Given information on BMI and obesity.  Encouraged patient to lose weight information on calorie restriction was given and advised.  Advised that calorie restriction was most important in her weight loss, over working out and exercising.  Information on insomnia as well.  Libby Maw, MD

## 2021-05-02 ENCOUNTER — Telehealth: Payer: Self-pay | Admitting: Family Medicine

## 2021-05-02 NOTE — Telephone Encounter (Signed)
Please advise message below, okay for order to have colonoscopy?

## 2021-05-02 NOTE — Addendum Note (Signed)
Addended by: Jon Billings on: 05/02/2021 12:34 PM   Modules accepted: Orders

## 2021-05-10 ENCOUNTER — Telehealth: Payer: Self-pay | Admitting: Family Medicine

## 2021-05-10 NOTE — Telephone Encounter (Signed)
Spoke with Tesla with [facility] scheduling per Tesla she will have to check with Dr. Newman Pies to see if he is able to add patient on his schedule to have colonoscopy in the same day as his [place] Per Tesla she will reach out to patient to inform if Dr. Newman Pies is able to, or not able to add patient to his schedule for colonoscopy. Patient ware he will receive a call from [facility] regarding this matter.

## 2021-05-13 ENCOUNTER — Encounter: Payer: Self-pay | Admitting: Family Medicine

## 2022-03-10 NOTE — Telephone Encounter (Signed)
Na

## 2022-05-01 ENCOUNTER — Encounter: Payer: Self-pay | Admitting: Family Medicine

## 2022-05-01 ENCOUNTER — Ambulatory Visit (INDEPENDENT_AMBULATORY_CARE_PROVIDER_SITE_OTHER): Payer: No Typology Code available for payment source | Admitting: Family Medicine

## 2022-05-01 VITALS — BP 128/84 | HR 82 | Temp 97.2°F | Ht 69.0 in | Wt 275.0 lb

## 2022-05-01 DIAGNOSIS — Z0000 Encounter for general adult medical examination without abnormal findings: Secondary | ICD-10-CM

## 2022-05-01 DIAGNOSIS — Z23 Encounter for immunization: Secondary | ICD-10-CM | POA: Diagnosis not present

## 2022-05-01 LAB — URINALYSIS, ROUTINE W REFLEX MICROSCOPIC
Bilirubin Urine: NEGATIVE
Hgb urine dipstick: NEGATIVE
Ketones, ur: NEGATIVE
Leukocytes,Ua: NEGATIVE
Nitrite: NEGATIVE
RBC / HPF: NONE SEEN (ref 0–?)
Specific Gravity, Urine: <=1.005 — AB (ref 1.000–1.030)
Total Protein, Urine: NEGATIVE
Urine Glucose: NEGATIVE
Urobilinogen, UA: 0.2 (ref 0.0–1.0)
WBC, UA: NONE SEEN (ref 0–?)
pH: 6.5 (ref 5.0–8.0)

## 2022-05-01 LAB — COMPREHENSIVE METABOLIC PANEL
ALT: 23 U/L (ref 0–53)
AST: 17 U/L (ref 0–37)
Albumin: 4.3 g/dL (ref 3.5–5.2)
Alkaline Phosphatase: 57 U/L (ref 39–117)
BUN: 15 mg/dL (ref 6–23)
CO2: 27 mEq/L (ref 19–32)
Calcium: 9.4 mg/dL (ref 8.4–10.5)
Chloride: 103 mEq/L (ref 96–112)
Creatinine, Ser: 1.04 mg/dL (ref 0.40–1.50)
GFR: 85.9 mL/min (ref >60.00)
Glucose, Bld: 106 mg/dL — ABNORMAL HIGH (ref 70–99)
Potassium: 4.3 mEq/L (ref 3.5–5.1)
Sodium: 139 mEq/L (ref 135–145)
Total Bilirubin: 1.3 mg/dL — ABNORMAL HIGH (ref 0.2–1.2)
Total Protein: 6.7 g/dL (ref 6.0–8.3)

## 2022-05-01 LAB — CBC
HCT: 46 % (ref 39.0–52.0)
Hemoglobin: 15.6 g/dL (ref 13.0–17.0)
MCHC: 33.9 g/dL (ref 30.0–36.0)
MCV: 86 fl (ref 78.0–100.0)
Platelets: 264 10*3/uL (ref 150.0–400.0)
RBC: 5.35 Mil/uL (ref 4.22–5.81)
RDW: 13.9 % (ref 11.5–15.5)
WBC: 6.1 10*3/uL (ref 4.0–10.5)

## 2022-05-01 LAB — LIPID PANEL
Cholesterol: 190 mg/dL (ref 0–200)
HDL: 77 mg/dL (ref >39.00)
LDL Cholesterol: 97 mg/dL (ref 0–99)
NonHDL: 113.23
Total CHOL/HDL Ratio: 2
Triglycerides: 79 mg/dL (ref 0.0–149.0)
VLDL: 15.8 mg/dL (ref 0.0–40.0)

## 2022-05-01 LAB — HEMOGLOBIN A1C: Hgb A1c MFr Bld: 5.6 % (ref 4.6–6.5)

## 2022-05-01 NOTE — Progress Notes (Signed)
Established Patient Office Visit  Subjective   Patient ID: Stephen Osborn, male    DOB: 01-09-1976  Age: 46 y.o. MRN: [id number]  Chief Complaint  Patient presents with   Annual Exam    CPE, no concerns. Patient fasting.     HPI for yearly physical exam.  Doing well.  Exercising most days at work by walking and/or going to the gym.  He does have regular dental care.  Weight loss has been a challenge.  Company is doing well.  No longer quite as stressed.  Sleeping well without assistance.    Review of Systems  Constitutional: Negative.   HENT: Negative.    Eyes:  Negative for blurred vision, discharge and redness.  Respiratory: Negative.    Cardiovascular: Negative.   Gastrointestinal:  Negative for abdominal pain.  Genitourinary: Negative.   Musculoskeletal: Negative.  Negative for myalgias.  Skin:  Negative for rash.  Neurological:  Negative for tingling, loss of consciousness and weakness.  Endo/Heme/Allergies:  Negative for polydipsia.      Objective:     BP 128/84 (BP Location: Right Arm, Patient Position: Sitting, Cuff Size: Large)   Pulse 82   Temp (!) 97.2 F (36.2 C) (Temporal)   Ht '5\' 9"'$  (1.753 m)   Wt 275 lb (124.7 kg)   SpO2 97%   BMI 40.61 kg/m  Wt Readings from Last 3 Encounters:  05/01/22 275 lb (124.7 kg)  04/27/21 270 lb (122.5 kg)  04/26/20 259 lb 6.4 oz (117.7 kg)      Physical Exam Constitutional:      General: He is not in acute distress.    Appearance: Normal appearance. He is not ill-appearing, toxic-appearing or diaphoretic.  HENT:     Head: Normocephalic and atraumatic.     Right Ear: External ear normal.     Left Ear: External ear normal.     Mouth/Throat:     Mouth: Mucous membranes are moist.     Pharynx: Oropharynx is clear. No oropharyngeal exudate or posterior oropharyngeal erythema.  Eyes:     General: No scleral icterus.       Right eye: No discharge.        Left eye: No discharge.     Extraocular Movements: Extraocular  movements intact.     Conjunctiva/sclera: Conjunctivae normal.     Pupils: Pupils are equal, round, and reactive to light.  Cardiovascular:     Rate and Rhythm: Normal rate and regular rhythm.  Pulmonary:     Effort: Pulmonary effort is normal. No respiratory distress.     Breath sounds: Normal breath sounds.  Abdominal:     General: Bowel sounds are normal. There is no distension.     Tenderness: There is no abdominal tenderness. There is no guarding.     Hernia: There is no hernia in the left inguinal area or right inguinal area.  Genitourinary:    Penis: Circumcised. No hypospadias, erythema, tenderness, discharge, swelling or lesions.      Testes:        Right: Mass, tenderness or swelling not present. Right testis is descended. Cremasteric reflex is present.         Left: Mass, tenderness or swelling not present. Left testis is descended. Cremasteric reflex is present.      Epididymis:     Right: Not inflamed or enlarged.     Left: Not inflamed or enlarged.  Musculoskeletal:     Cervical back: No rigidity or tenderness.  Lymphadenopathy:  Lower Body: No right inguinal adenopathy. No left inguinal adenopathy.  Skin:    General: Skin is warm and dry.  Neurological:     Mental Status: He is alert and oriented to person, place, and time.  Psychiatric:        Mood and Affect: Mood normal.        Behavior: Behavior normal.      No results found for any visits on 05/01/22.    The 10-year ASCVD risk score (Arnett DK, et al., 2019) is: 1.3%    Assessment & Plan:   Problem List Items Addressed This Visit       Other   Healthcare maintenance - Primary   Relevant Orders   CBC   Comprehensive metabolic panel   Lipid panel   Hemoglobin A1c   Urinalysis, Routine w reflex microscopic   Need for influenza vaccination   Relevant Orders   Flu Vaccine QUAD 6+ mos PF IM (Fluarix Quad PF)   Morbid obesity ([place])    Return in about 1 year (around 05/02/2023), or if  symptoms worsen or fail to improve.  Encouraged him to continue regular exercise.  Discussed medical weight loss management.  Information was given on calorie counting to lose weight.  Libby Maw, MD

## 2022-05-16 ENCOUNTER — Encounter: Payer: Self-pay | Admitting: Family Medicine

## 2023-05-03 ENCOUNTER — Other Ambulatory Visit: Payer: Self-pay | Admitting: Family Medicine

## 2023-05-03 ENCOUNTER — Encounter: Payer: Self-pay | Admitting: Family Medicine

## 2023-05-03 ENCOUNTER — Ambulatory Visit: Payer: No Typology Code available for payment source | Admitting: Family Medicine

## 2023-05-03 VITALS — BP 140/80 | HR 86 | Temp 97.7°F | Wt 280.2 lb

## 2023-05-03 DIAGNOSIS — Z6841 Body Mass Index (BMI) 40.0 and over, adult: Secondary | ICD-10-CM

## 2023-05-03 DIAGNOSIS — Z23 Encounter for immunization: Secondary | ICD-10-CM

## 2023-05-03 DIAGNOSIS — Z131 Encounter for screening for diabetes mellitus: Secondary | ICD-10-CM | POA: Diagnosis not present

## 2023-05-03 DIAGNOSIS — Z13220 Encounter for screening for lipoid disorders: Secondary | ICD-10-CM | POA: Diagnosis not present

## 2023-05-03 DIAGNOSIS — Z0000 Encounter for general adult medical examination without abnormal findings: Secondary | ICD-10-CM

## 2023-05-03 LAB — URINALYSIS, ROUTINE W REFLEX MICROSCOPIC
Bilirubin Urine: NEGATIVE
Hgb urine dipstick: NEGATIVE
Ketones, ur: NEGATIVE
Leukocytes,Ua: NEGATIVE
Nitrite: NEGATIVE
RBC / HPF: NONE SEEN (ref 0–?)
Specific Gravity, Urine: <=1.005 — AB (ref 1.000–1.030)
Total Protein, Urine: NEGATIVE
Urine Glucose: NEGATIVE
Urobilinogen, UA: 0.2 (ref 0.0–1.0)
pH: 6 (ref 5.0–8.0)

## 2023-05-03 LAB — COMPREHENSIVE METABOLIC PANEL
ALT: 55 U/L — ABNORMAL HIGH (ref 0–53)
AST: 25 U/L (ref 0–37)
Albumin: 4.3 g/dL (ref 3.5–5.2)
Alkaline Phosphatase: 56 U/L (ref 39–117)
BUN: 13 mg/dL (ref 6–23)
CO2: 27 meq/L (ref 19–32)
Calcium: 9.5 mg/dL (ref 8.4–10.5)
Chloride: 104 meq/L (ref 96–112)
Creatinine, Ser: 1.04 mg/dL (ref 0.40–1.50)
GFR: 85.3 mL/min (ref >60.00)
Glucose, Bld: 96 mg/dL (ref 70–99)
Potassium: 4.2 meq/L (ref 3.5–5.1)
Sodium: 139 meq/L (ref 135–145)
Total Bilirubin: 1.8 mg/dL — ABNORMAL HIGH (ref 0.2–1.2)
Total Protein: 6.5 g/dL (ref 6.0–8.3)

## 2023-05-03 LAB — HEMOGLOBIN A1C: Hgb A1c MFr Bld: 5.4 % (ref 4.6–6.5)

## 2023-05-03 LAB — LIPID PANEL
Cholesterol: 207 mg/dL — ABNORMAL HIGH (ref 0–200)
HDL: 71.8 mg/dL (ref >39.00)
LDL Cholesterol: 122 mg/dL — ABNORMAL HIGH (ref 0–99)
NonHDL: 135.22
Total CHOL/HDL Ratio: 3
Triglycerides: 67 mg/dL (ref 0.0–149.0)
VLDL: 13.4 mg/dL (ref 0.0–40.0)

## 2023-05-03 LAB — CBC
HCT: 48.3 % (ref 39.0–52.0)
Hemoglobin: 15.9 g/dL (ref 13.0–17.0)
MCHC: 33 g/dL (ref 30.0–36.0)
MCV: 88.4 fL (ref 78.0–100.0)
Platelets: 306 10*3/uL (ref 150.0–400.0)
RBC: 5.46 Mil/uL (ref 4.22–5.81)
RDW: 13.4 % (ref 11.5–15.5)
WBC: 6.9 10*3/uL (ref 4.0–10.5)

## 2023-05-03 MED ORDER — TIRZEPATIDE-WEIGHT MANAGEMENT 2.5 MG/0.5ML ~~LOC~~ SOLN
2.5000 mg | SUBCUTANEOUS | 2 refills | Status: DC
Start: 2023-05-03 — End: 2023-05-11

## 2023-05-03 NOTE — Progress Notes (Addendum)
Established Patient Office Visit   Subjective:  Patient ID: Stephen Osborn, male    DOB: June 02, 1976  Age: 47 y.o. MRN: [id number]  Chief Complaint  Patient presents with   Annual Exam    HPI Encounter Diagnoses  Name Primary?   Healthcare maintenance Yes   Need for influenza vaccination    Morbid obesity with BMI of 40.0-44.9, adult (HCC)    Screening for cholesterol level    Screening for diabetes mellitus    For complete physical and follow-up of his struggle with obesity.  Weight continues to decline bite efforts dieting and exercise.  Dental care.  Up-to-date on health maintenance.  He has worked with a Control and instrumentation engineer.   Review of Systems  Constitutional: Negative.   HENT: Negative.    Eyes:  Negative for blurred vision, discharge and redness.  Respiratory: Negative.    Cardiovascular: Negative.   Gastrointestinal:  Negative for abdominal pain.  Genitourinary: Negative.   Musculoskeletal: Negative.  Negative for myalgias.  Skin:  Negative for rash.  Neurological:  Negative for tingling, loss of consciousness and weakness.  Endo/Heme/Allergies:  Negative for polydipsia.      05/01/2022    8:36 AM 05/01/2022    8:07 AM 04/27/2021    8:37 AM  Depression screen PHQ 2/9  Decreased Interest 0 0 0  Down, Depressed, Hopeless 0 0 0  PHQ - 2 Score 0 0 0  Altered sleeping 0  1  Tired, decreased energy 0  0  Change in appetite 0  1  Feeling bad or failure about yourself  0  1  Trouble concentrating 0  1  Moving slowly or fidgety/restless 0  0  Suicidal thoughts 0  0  PHQ-9 Score 0  4  Difficult doing work/chores Not difficult at all  Not difficult at all       Current Outpatient Medications:    fish oil-omega-3 fatty acids 1000 MG capsule, Take 2 g by mouth daily., Disp: , Rfl:    fluticasone (CUTIVATE) 0.05 % cream, APPLY TO AFFECTED AREA TWICE A DAY, Disp: , Rfl:    glucosamine-chondroitin 500-400 MG tablet, Take 1 tablet by mouth daily. , Disp: , Rfl:     Multiple Vitamin (MULTIVITAMIN WITH MINERALS) TABS, Take 1 tablet by mouth daily., Disp: , Rfl:    NON FORMULARY, 1,200 mg. Caralluma Fimbriata, Disp: , Rfl:    Psyllium (METAMUCIL PO), Take by mouth., Disp: , Rfl:    tirzepatide (ZEPBOUND) 2.5 MG/0.5ML injection vial, Inject 2.5 mg into the skin once a week., Disp: 2 mL, Rfl: 2   Turmeric POWD, by Misc.(Non-Drug; Combo Route) route., Disp: , Rfl:    Objective:     BP (!) 140/80   Pulse 86   Temp 97.7 F (36.5 C) (Temporal)   Wt 280 lb 3.2 oz (127.1 kg)   BMI 41.38 kg/m  BP Readings from Last 3 Encounters:  05/03/23 (!) 140/80  05/01/22 128/84  04/27/21 116/76   Wt Readings from Last 3 Encounters:  05/03/23 280 lb 3.2 oz (127.1 kg)  05/01/22 275 lb (124.7 kg)  04/27/21 270 lb (122.5 kg)      Physical Exam Constitutional:      General: He is not in acute distress.    Appearance: Normal appearance. He is obese. He is not ill-appearing, toxic-appearing or diaphoretic.  HENT:     Head: Normocephalic and atraumatic.     Right Ear: External ear normal.     Left Ear: External ear normal.  Mouth/Throat:     Mouth: Mucous membranes are moist.     Pharynx: Oropharynx is clear. No oropharyngeal exudate or posterior oropharyngeal erythema.  Eyes:     General: No scleral icterus.       Right eye: No discharge.        Left eye: No discharge.     Extraocular Movements: Extraocular movements intact.     Conjunctiva/sclera: Conjunctivae normal.     Pupils: Pupils are equal, round, and reactive to light.  Cardiovascular:     Rate and Rhythm: Normal rate and regular rhythm.  Pulmonary:     Effort: Pulmonary effort is normal. No respiratory distress.     Breath sounds: Normal breath sounds.  Abdominal:     General: Bowel sounds are normal.     Tenderness: There is no abdominal tenderness. There is no guarding.     Hernia: There is no hernia in the left inguinal area or right inguinal area.  Genitourinary:    Penis: Circumcised.  No hypospadias, erythema, tenderness, discharge, swelling or lesions.      Testes:        Right: Mass, tenderness or swelling not present. Right testis is descended.        Left: Mass, tenderness or swelling not present. Left testis is descended.     Epididymis:     Right: Not inflamed or enlarged.     Left: Not inflamed or enlarged.  Musculoskeletal:     Cervical back: No rigidity or tenderness.  Lymphadenopathy:     Cervical: No cervical adenopathy.     Lower Body: No right inguinal adenopathy. No left inguinal adenopathy.  Skin:    General: Skin is warm and dry.  Neurological:     Mental Status: He is alert and oriented to person, place, and time.  Psychiatric:        Mood and Affect: Mood normal.        Behavior: Behavior normal.      No results found for any visits on 05/03/23.    The 10-year ASCVD risk score (Arnett DK, et al., 2019) is: 1.6%    Assessment & Plan:   Healthcare maintenance -     CBC -     Comprehensive metabolic panel -     Urinalysis, Routine w reflex microscopic  Need for influenza vaccination -     Flu vaccine trivalent PF, 6mos and older(Flulaval,Afluria,Fluarix,Fluzone)  Morbid obesity with BMI of 40.0-44.9, adult (HCC) -     Tirzepatide-Weight Management; Inject 2.5 mg into the skin once a week.  Dispense: 2 mL; Refill: 2  Screening for cholesterol level -     Comprehensive metabolic panel -     Lipid panel  Screening for diabetes mellitus -     Comprehensive metabolic panel -     Hemoglobin A1c    Return in about 3 months (around 08/03/2023).  Information was given on health maintenance and disease prevention.  Patient will like to try tirzepatide for weight loss.  Discussed theoretical risk for C cancer.  Discussed the risk for intestinal obstruction and pancreatitis.  Information was given on calorie counting to lose weight.  Continue working out in [employer] and exercise.  Would be willing to titrate monthly pending tolerance of  current dose.  Mliss Sax, MD

## 2023-05-15 ENCOUNTER — Encounter: Payer: Self-pay | Admitting: Family Medicine

## 2023-05-15 MED ORDER — WEGOVY 0.25 MG/0.5ML ~~LOC~~ SOAJ: 0.2500 mg | SUBCUTANEOUS | 2 refills | Status: DC

## 2023-05-17 ENCOUNTER — Other Ambulatory Visit (HOSPITAL_COMMUNITY): Payer: Self-pay

## 2023-05-17 ENCOUNTER — Telehealth: Payer: Self-pay | Admitting: Pharmacist

## 2023-05-17 NOTE — Telephone Encounter (Signed)
Pharmacy Patient Advocate Encounter  Received notification from [employer] that Prior Authorization for Wegovy 0.25MG /0.5ML auto-injectors has been APPROVED from 05/17/2023 to 05/16/2024

## 2023-05-17 NOTE — Telephone Encounter (Signed)
Pharmacy Patient Advocate Encounter   Received notification from Physician's Office that prior authorization for Wegovy 0.25MG /0.5ML auto-injectors is required/requested.   Insurance verification completed.   The patient is insured through [employer] .   Per test claim: PA required; PA submitted to above mentioned insurance via CoverMyMeds Key/confirmation #/EOC [id number] Status is pending

## 2023-06-07 ENCOUNTER — Other Ambulatory Visit: Payer: Self-pay

## 2023-06-07 ENCOUNTER — Ambulatory Visit (INDEPENDENT_AMBULATORY_CARE_PROVIDER_SITE_OTHER): Payer: No Typology Code available for payment source | Admitting: Family Medicine

## 2023-06-07 VITALS — BP 151/98 | Ht 69.0 in | Wt 262.0 lb

## 2023-06-07 DIAGNOSIS — M25521 Pain in right elbow: Secondary | ICD-10-CM

## 2023-06-07 MED ORDER — MELOXICAM 15 MG PO TABS: 15.0000 mg | ORAL_TABLET | Freq: Every day | ORAL | 1 refills | Status: DC

## 2023-06-07 NOTE — Patient Instructions (Addendum)
You have lateral epicondylitis Try to avoid painful activities as much as possible. Ice the area 3-4 times a day for 15 minutes at a time. Meloxicam 15mg  daily with food for pain and inflammation - take for 7-10 days then as needed.  Don't take aleve or ibuprofen while on this. Counterforce brace as directed can help unload area - wear this regularly if it provides you with relief. Do home exercises as directed. Consider physical therapy, nitro patches, shockwave therapy if not improving. Follow up in 6 weeks.

## 2023-06-07 NOTE — Progress Notes (Signed)
PCP: Mliss Sax, MD  Subjective:   HPI: Patient is a 47 y.o. male here for evaluation of right lateral elbow pain.  He has recently been playing golf with a coach as well as using a golfing simulator.  The pain started over 2 weeks ago.  After resting, using ibuprofen, and using a brace, the pain has gotten a little better.  However, he feels a small bump on his lateral elbow and this, in addition to the persistent pain, is why he presents for evaluation.  He denies any weakness or sensory changes.  No significant joint swelling, erythema, warmth.  No past medical history on file.  Current Outpatient Medications on File Prior to Visit  Medication Sig Dispense Refill   fish oil-omega-3 fatty acids 1000 MG capsule Take 2 g by mouth daily.     fluticasone (CUTIVATE) 0.05 % cream APPLY TO AFFECTED AREA TWICE A DAY     glucosamine-chondroitin 500-400 MG tablet Take 1 tablet by mouth daily.      Multiple Vitamin (MULTIVITAMIN WITH MINERALS) TABS Take 1 tablet by mouth daily.     NON FORMULARY 1,200 mg. Caralluma Fimbriata     Psyllium (METAMUCIL PO) Take by mouth.     Semaglutide-Weight Management (WEGOVY) 0.25 MG/0.5ML SOAJ Inject 0.25 mg into the skin once a week. 2 mL 2   Turmeric POWD by Misc.(Non-Drug; Combo Route) route.     No current facility-administered medications on file prior to visit.    Past Surgical History:  Procedure Laterality Date   CHOLECYSTECTOMY     LIPECTOMY      No Known Allergies  There were no vitals taken for this visit.      No data to display              No data to display              Objective:  Physical Exam:  Gen: NAD, comfortable in exam room  Right elbow: No swelling or effusion Mild TTP over lateral epicondyle without tenderness to olecranon or medial epicondyle FROM with mild pain to full elbow extension and resisted third finger extension No pain with resisted wrist flexion or extension N/V intact with normal  strength Negative hook testing as well as valgus/varus testing  Limited ultrasound right elbow: Small hypoechoic fluid collection at proximal extensor carpi radialis brevis tendon insertion to lateral epicondyle, mild spurring at tip of lateral epicondyle   Assessment & Plan:  1.  Right lateral epicondylitis: Persistent though improved symptoms after ibuprofen use and bracing at home.  Discussed meloxicam use, icing, home exercise, counterforce bracing, and resting the elbow. Follow-up in 6 weeks. Consider PT, nitro patches, and shockwave therapy if not improving.  Janeal Holmes, MD PGY-2, [facility]

## 2023-06-08 ENCOUNTER — Encounter: Payer: Self-pay | Admitting: Family Medicine

## 2023-07-24 ENCOUNTER — Other Ambulatory Visit: Payer: Self-pay | Admitting: Family Medicine

## 2023-08-04 ENCOUNTER — Other Ambulatory Visit: Payer: Self-pay | Admitting: Family Medicine

## 2023-08-07 ENCOUNTER — Ambulatory Visit: Payer: No Typology Code available for payment source | Admitting: Family Medicine

## 2023-08-10 ENCOUNTER — Encounter: Payer: Self-pay | Admitting: Family Medicine

## 2023-08-10 ENCOUNTER — Ambulatory Visit (INDEPENDENT_AMBULATORY_CARE_PROVIDER_SITE_OTHER): Payer: No Typology Code available for payment source | Admitting: Family Medicine

## 2023-08-10 VITALS — BP 140/86 | HR 87 | Temp 97.8°F | Ht 69.0 in | Wt 273.4 lb

## 2023-08-10 DIAGNOSIS — R7401 Elevation of levels of liver transaminase levels: Secondary | ICD-10-CM | POA: Diagnosis not present

## 2023-08-10 DIAGNOSIS — I10 Essential (primary) hypertension: Secondary | ICD-10-CM | POA: Insufficient documentation

## 2023-08-10 DIAGNOSIS — Z6841 Body Mass Index (BMI) 40.0 and over, adult: Secondary | ICD-10-CM

## 2023-08-10 LAB — BASIC METABOLIC PANEL
BUN: 13 mg/dL (ref 6–23)
CO2: 26 meq/L (ref 19–32)
Calcium: 9 mg/dL (ref 8.4–10.5)
Chloride: 105 meq/L (ref 96–112)
Creatinine, Ser: 0.85 mg/dL (ref 0.40–1.50)
GFR: 103.03 mL/min (ref >60.00)
Glucose, Bld: 95 mg/dL (ref 70–99)
Potassium: 4.2 meq/L (ref 3.5–5.1)
Sodium: 137 meq/L (ref 135–145)

## 2023-08-10 LAB — HEPATIC FUNCTION PANEL
ALT: 30 U/L (ref 0–53)
AST: 15 U/L (ref 0–37)
Albumin: 4.3 g/dL (ref 3.5–5.2)
Alkaline Phosphatase: 49 U/L (ref 39–117)
Bilirubin, Direct: 0.2 mg/dL (ref 0.0–0.3)
Total Bilirubin: 1.4 mg/dL — ABNORMAL HIGH (ref 0.2–1.2)
Total Protein: 6.5 g/dL (ref 6.0–8.3)

## 2023-08-10 MED ORDER — AMLODIPINE BESYLATE 5 MG PO TABS: 5.0000 mg | ORAL_TABLET | Freq: Every day | ORAL | 1 refills | Status: DC

## 2023-08-10 MED ORDER — SEMAGLUTIDE-WEIGHT MANAGEMENT 0.5 MG/0.5ML ~~LOC~~ SOAJ: 0.5000 mg | SUBCUTANEOUS | 1 refills | Status: DC

## 2023-08-10 NOTE — Progress Notes (Signed)
Established Patient Office Visit   Subjective:  Patient ID: Stephen Osborn, male    DOB: Jul 18, 1975  Age: 48 y.o. MRN: [id number]  Chief Complaint  Patient presents with   Medical Management of Chronic Issues    3 month follow up.    HPI Encounter Diagnoses  Name Primary?   Morbid obesity with BMI of 40.0-44.9, adult (HCC) Yes   Essential hypertension    Elevated ALT measurement    Follow-up of above.  Some difficulty obtaining semaglutide and just started after Thanksgiving.  To lose some weight with it.  Denies any significant side effects.  He is exercising regularly throughout the week at least 150 minutes.  Semaglutide has helped the appetite somewhat.  Pressure has been elevated his last 3 visits.  His father had a stroke at age 20 and remained bedridden for the rest of his life, until age 64 when he passed.   Review of Systems  Constitutional: Negative.   HENT: Negative.    Eyes:  Negative for blurred vision, discharge and redness.  Respiratory: Negative.    Cardiovascular: Negative.   Gastrointestinal:  Negative for abdominal pain.  Genitourinary: Negative.   Musculoskeletal: Negative.  Negative for myalgias.  Skin:  Negative for rash.  Neurological:  Negative for tingling, loss of consciousness and weakness.  Endo/Heme/Allergies:  Negative for polydipsia.     Current Outpatient Medications:    amLODipine (NORVASC) 5 MG tablet, Take 1 tablet (5 mg total) by mouth daily., Disp: 90 tablet, Rfl: 1   fish oil-omega-3 fatty acids 1000 MG capsule, Take 2 g by mouth daily., Disp: , Rfl:    glucosamine-chondroitin 500-400 MG tablet, Take 1 tablet by mouth daily. , Disp: , Rfl:    meloxicam (MOBIC) 15 MG tablet, Take 1 tablet (15 mg total) by mouth daily., Disp: 30 tablet, Rfl: 1   Multiple Vitamin (MULTIVITAMIN WITH MINERALS) TABS, Take 1 tablet by mouth daily., Disp: , Rfl:    NON FORMULARY, 1,200 mg. Caralluma Fimbriata, Disp: , Rfl:    Psyllium (METAMUCIL PO), Take by  mouth., Disp: , Rfl:    Semaglutide-Weight Management 0.5 MG/0.5ML SOAJ, Inject 0.5 mg into the skin once a week., Disp: 6 mL, Rfl: 1   Turmeric POWD, by Misc.(Non-Drug; Combo Route) route., Disp: , Rfl:    fluticasone (CUTIVATE) 0.05 % cream, APPLY TO AFFECTED AREA TWICE A DAY, Disp: , Rfl:    Objective:     BP (!) 140/86 (Cuff Size: Large)   Pulse 87   Temp 97.8 F (36.6 C)   Ht 5\' 9"  (1.753 m)   Wt 273 lb 6.4 oz (124 kg)   SpO2 97%   BMI 40.37 kg/m  BP Readings from Last 3 Encounters:  08/10/23 (!) 140/86  06/07/23 (!) 151/98  05/03/23 (!) 140/80   Wt Readings from Last 3 Encounters:  08/10/23 273 lb 6.4 oz (124 kg)  06/07/23 262 lb (118.8 kg)  05/03/23 280 lb 3.2 oz (127.1 kg)      Physical Exam Constitutional:      General: He is not in acute distress.    Appearance: Normal appearance. He is not ill-appearing, toxic-appearing or diaphoretic.  HENT:     Head: Normocephalic and atraumatic.     Right Ear: External ear normal.     Left Ear: External ear normal.  Eyes:     General: No scleral icterus.       Right eye: No discharge.        Left eye:  No discharge.     Extraocular Movements: Extraocular movements intact.     Conjunctiva/sclera: Conjunctivae normal.  Pulmonary:     Effort: Pulmonary effort is normal. No respiratory distress.  Skin:    General: Skin is warm and dry.  Neurological:     Mental Status: He is alert and oriented to person, place, and time.  Psychiatric:        Mood and Affect: Mood normal.        Behavior: Behavior normal.      No results found for any visits on 08/10/23.    The 10-year ASCVD risk score (Arnett DK, et al., 2019) is: 2.7%    Assessment & Plan:   Morbid obesity with BMI of 40.0-44.9, adult (HCC) -     Semaglutide-Weight Management; Inject 0.5 mg into the skin once a week.  Dispense: 6 mL; Refill: 1  Essential hypertension -     Basic metabolic panel -     amLODIPine Besylate; Take 1 tablet (5 mg total) by  mouth daily.  Dispense: 90 tablet; Refill: 1  Elevated ALT measurement -     Hepatic function panel -     Hepatitis C antibody -     Hepatitis B surface antigen    Return in about 3 months (around 11/08/2023).  Starting amlodipine 5 mg for hypertension.  Information given on amlodipine as well as managing hypertension.  Advised that weight loss could also help to lower his blood pressure.  Have increased semaglutide to 0.5 mg daily.  He is doing well with it.  Follow-up of ALT elevation likely due to body habitus.  Mliss Sax, MD

## 2023-08-11 LAB — HEPATITIS C ANTIBODY: Hepatitis C Ab: NONREACTIVE

## 2023-08-11 LAB — HEPATITIS B SURFACE ANTIGEN: Hepatitis B Surface Ag: NONREACTIVE

## 2023-08-13 ENCOUNTER — Encounter: Payer: Self-pay | Admitting: Family Medicine

## 2023-10-05 ENCOUNTER — Other Ambulatory Visit: Payer: Self-pay | Admitting: Family Medicine

## 2023-10-05 DIAGNOSIS — Z6841 Body Mass Index (BMI) 40.0 and over, adult: Secondary | ICD-10-CM

## 2023-11-01 ENCOUNTER — Other Ambulatory Visit: Payer: Self-pay | Admitting: Family Medicine

## 2023-11-01 ENCOUNTER — Ambulatory Visit (INDEPENDENT_AMBULATORY_CARE_PROVIDER_SITE_OTHER): Payer: No Typology Code available for payment source | Admitting: Family Medicine

## 2023-11-01 ENCOUNTER — Encounter: Payer: Self-pay | Admitting: Family Medicine

## 2023-11-01 VITALS — BP 140/88 | HR 104 | Temp 97.7°F | Resp 18 | Wt 275.8 lb

## 2023-11-01 DIAGNOSIS — I10 Essential (primary) hypertension: Secondary | ICD-10-CM | POA: Diagnosis not present

## 2023-11-01 DIAGNOSIS — Z6841 Body Mass Index (BMI) 40.0 and over, adult: Secondary | ICD-10-CM | POA: Diagnosis not present

## 2023-11-01 DIAGNOSIS — K219 Gastro-esophageal reflux disease without esophagitis: Secondary | ICD-10-CM

## 2023-11-01 MED ORDER — WEGOVY 1 MG/0.5ML ~~LOC~~ SOAJ: 1.0000 mg | SUBCUTANEOUS | 1 refills | Status: DC

## 2023-11-01 MED ORDER — AMLODIPINE BESYLATE 10 MG PO TABS: 10.0000 mg | ORAL_TABLET | Freq: Every day | ORAL | 1 refills | Status: DC

## 2023-11-01 NOTE — Progress Notes (Addendum)
 Established Patient Office Visit   Subjective:  Patient ID: Stephen Osborn, male    DOB: December 18, 1975  Age: 48 y.o. MRN: [id number]  Chief Complaint  Patient presents with   Medical Management of Chronic Issues    3 month follow up. Pt have RX concerns with Wegovy (1 month without)    HPI Encounter Diagnoses  Name Primary?   Essential hypertension Yes   Morbid obesity with BMI of 40.0-44.9, adult (HCC)        For follow-up of above.  Doing well with the semaglutide 0.5 mg dose.  Has developed some reflux.  It has been controlled with omeprazole.  Denies chest pain or shortness of breath with it.  Continues to exercise regularly.  Blood pressure remains in the 140s over 80s on 5 mg of amlodipine   Review of Systems  Constitutional: Negative.   HENT: Negative.    Eyes:  Negative for blurred vision, discharge and redness.  Respiratory: Negative.    Cardiovascular: Negative.   Gastrointestinal:  Positive for heartburn. Negative for abdominal pain, nausea and vomiting.  Genitourinary: Negative.   Musculoskeletal: Negative.  Negative for myalgias.  Skin:  Negative for rash.  Neurological:  Negative for tingling, loss of consciousness and weakness.  Endo/Heme/Allergies:  Negative for polydipsia.      11/01/2023    2:05 PM 05/01/2022    8:36 AM 05/01/2022    8:07 AM  Depression screen PHQ 2/9  Decreased Interest 0 0 0  Down, Depressed, Hopeless 0 0 0  PHQ - 2 Score 0 0 0  Altered sleeping  0   Tired, decreased energy  0   Change in appetite  0   Feeling bad or failure about yourself   0   Trouble concentrating  0   Moving slowly or fidgety/restless  0   Suicidal thoughts  0   PHQ-9 Score  0   Difficult doing work/chores  Not difficult at all       Current Outpatient Medications:    amLODipine (NORVASC) 10 MG tablet, Take 1 tablet (10 mg total) by mouth daily., Disp: 90 tablet, Rfl: 1   fish oil-omega-3 fatty acids 1000 MG capsule, Take 2 g by mouth daily., Disp: , Rfl:     glucosamine-chondroitin 500-400 MG tablet, Take 1 tablet by mouth daily. , Disp: , Rfl:    meloxicam (MOBIC) 15 MG tablet, Take 1 tablet (15 mg total) by mouth daily., Disp: 30 tablet, Rfl: 1   Multiple Vitamin (MULTIVITAMIN WITH MINERALS) TABS, Take 1 tablet by mouth daily., Disp: , Rfl:    NON FORMULARY, 1,200 mg. Caralluma Fimbriata, Disp: , Rfl:    Psyllium (METAMUCIL PO), Take by mouth., Disp: , Rfl:    Semaglutide-Weight Management (WEGOVY) 1 MG/0.5ML SOAJ, Inject 1 mg into the skin once a week., Disp: 6 mL, Rfl: 1   Turmeric POWD, by Misc.(Non-Drug; Combo Route) route., Disp: , Rfl:    Objective:     BP (!) 140/88 (BP Location: Left Arm, Patient Position: Sitting, Cuff Size: Large) Comment: recheck  Pulse (!) 104   Temp 97.7 F (36.5 C) (Temporal)   Resp 18   Wt 275 lb 12.8 oz (125.1 kg)   SpO2 97%   BMI 40.73 kg/m  BP Readings from Last 3 Encounters:  11/01/23 (!) 140/88  08/10/23 (!) 140/86  06/07/23 (!) 151/98   Wt Readings from Last 3 Encounters:  11/01/23 275 lb 12.8 oz (125.1 kg)  08/10/23 273 lb 6.4 oz (124 kg)  06/07/23 262 lb (118.8 kg)      Physical Exam Constitutional:      General: He is not in acute distress.    Appearance: Normal appearance. He is not ill-appearing, toxic-appearing or diaphoretic.  HENT:     Head: Normocephalic and atraumatic.     Right Ear: External ear normal.     Left Ear: External ear normal.     Mouth/Throat:     Mouth: Mucous membranes are moist.     Pharynx: Oropharynx is clear. No oropharyngeal exudate or posterior oropharyngeal erythema.  Eyes:     General: No scleral icterus.       Right eye: No discharge.        Left eye: No discharge.     Extraocular Movements: Extraocular movements intact.     Conjunctiva/sclera: Conjunctivae normal.     Pupils: Pupils are equal, round, and reactive to light.  Cardiovascular:     Rate and Rhythm: Normal rate and regular rhythm.  Pulmonary:     Effort: Pulmonary effort is  normal. No respiratory distress.     Breath sounds: Normal breath sounds. No wheezing or rales.  Musculoskeletal:     Cervical back: No rigidity or tenderness.  Skin:    General: Skin is warm and dry.  Neurological:     Mental Status: He is alert and oriented to person, place, and time.  Psychiatric:        Mood and Affect: Mood normal.        Behavior: Behavior normal.      No results found for any visits on 11/01/23.    The 10-year ASCVD risk score (Arnett DK, et al., 2019) is: 2.7%    Assessment & Plan:   Essential hypertension -     amLODIPine Besylate; Take 1 tablet (10 mg total) by mouth daily.  Dispense: 90 tablet; Refill: 1  Morbid obesity with BMI of 40.0-44.9, adult (HCC)  Other orders -     VHQION; Inject 1 mg into the skin once a week.  Dispense: 6 mL; Refill: 1    Return in about 3 months (around 01/31/2024), or Increase semaglutide to 1 mg weekly.  Increase amlodipine to 10 mg daily..  Increase amlodipine to 10 mg daily increase semaglutide to 1 mg weekly.  Will look out for lower extremity edema.  May continue omeprazole for reflux.  Information given on managing htn. Information given on reflux.   Tonna Frederic, MD

## 2023-11-02 ENCOUNTER — Telehealth: Payer: Self-pay | Admitting: Pharmacy Technician

## 2023-11-02 ENCOUNTER — Other Ambulatory Visit (HOSPITAL_COMMUNITY): Payer: Self-pay

## 2023-11-02 NOTE — Telephone Encounter (Signed)
 PA request has been Received. New Encounter has been or will be created for follow up. For additional info see Pharmacy Prior Auth telephone encounter from 11/02/23.    No PA needed at this time, his ins limits to 1 fill per month and his copay is $0.00

## 2023-11-02 NOTE — Telephone Encounter (Signed)
 Pharmacy Patient Advocate Encounter   Received notification from RX Request Messages that prior authorization for WEGOVY  1MG  is required/requested.   Insurance verification completed.   The patient is insured through [employer] .   Per test claim: The current 28 day co-pay is, $0.00.  No PA needed at this time. This test claim was processed through [facility]- copay amounts may vary at other pharmacies due to pharmacy/plan contracts, or as the patient moves through the different stages of their insurance plan.

## 2023-11-06 NOTE — Telephone Encounter (Signed)
 Electronic transmission notice received for refill. Called pharmacy and confirmed that script was received. No further action needed at this time.

## 2024-01-03 ENCOUNTER — Other Ambulatory Visit: Payer: Self-pay | Admitting: Family Medicine

## 2024-01-10 ENCOUNTER — Other Ambulatory Visit: Payer: Self-pay | Admitting: Family Medicine

## 2024-01-15 ENCOUNTER — Other Ambulatory Visit: Payer: Self-pay | Admitting: Family Medicine

## 2024-01-15 ENCOUNTER — Telehealth: Payer: Self-pay

## 2024-01-15 ENCOUNTER — Other Ambulatory Visit (HOSPITAL_COMMUNITY): Payer: Self-pay

## 2024-01-15 NOTE — Telephone Encounter (Signed)
 Prescription needs a PA   please call them at [phone].   Thanks.  Karna HERO, cma

## 2024-01-15 NOTE — Telephone Encounter (Signed)
 Copied from CRM [id number]. Topic: Clinical - Prescription Issue >> Jan 15, 2024  9:03 AM Berneda FALCON wrote: Reason for CRM: Pt states the pharmacy is not filling his RX for Wegovy  and this is now his second week without it. When looking, I see that it was filled on 6/27/ I let him know this and he states he will call the pharmacy again or go over there and speak to the pharmacist to see what the issue is.

## 2024-01-24 ENCOUNTER — Encounter: Payer: Self-pay | Admitting: Family Medicine

## 2024-01-25 ENCOUNTER — Telehealth: Payer: Self-pay | Admitting: Pharmacy Technician

## 2024-01-25 ENCOUNTER — Other Ambulatory Visit (HOSPITAL_COMMUNITY): Payer: Self-pay

## 2024-01-25 MED ORDER — WEGOVY 1 MG/0.5ML ~~LOC~~ SOAJ: 1.0000 mg | SUBCUTANEOUS | 1 refills | Status: DC

## 2024-01-25 NOTE — Telephone Encounter (Signed)
 PA request has been Received. New Encounter has been or will be created for follow up. For additional info see Pharmacy Prior Auth telephone encounter from 01/25/2024.

## 2024-01-25 NOTE — Telephone Encounter (Signed)
 Pharmacy Patient Advocate Encounter   Received notification from Patient Advice Request messages that prior authorization for WEGOVY  1MG /0.5ML AUTO-INJECTORS is required/requested.   Insurance verification completed.   The patient is insured through [employer] .   Per test claim: MAX OF 4 EVERY 180 DAYS.  Insurance would require patient to taper up to the 1.7mg /0.67ml dose. Ran test claim and it does process as a $0.00 copay. Please advise on dose change?

## 2024-01-31 ENCOUNTER — Ambulatory Visit (INDEPENDENT_AMBULATORY_CARE_PROVIDER_SITE_OTHER): Admitting: Family Medicine

## 2024-01-31 ENCOUNTER — Encounter: Payer: Self-pay | Admitting: Family Medicine

## 2024-01-31 VITALS — BP 124/72 | HR 77 | Temp 99.1°F | Ht 69.0 in | Wt 272.8 lb

## 2024-01-31 DIAGNOSIS — Z6841 Body Mass Index (BMI) 40.0 and over, adult: Secondary | ICD-10-CM | POA: Diagnosis not present

## 2024-01-31 DIAGNOSIS — I10 Essential (primary) hypertension: Secondary | ICD-10-CM

## 2024-01-31 MED ORDER — WEGOVY 1.7 MG/0.75ML ~~LOC~~ SOAJ: 1.7000 mg | SUBCUTANEOUS | 2 refills | Status: DC

## 2024-01-31 NOTE — Progress Notes (Signed)
 Established Patient Office Visit   Subjective:  Patient ID: Stephen Osborn, male    DOB: [birth year]  Age: 48 y.o. MRN: [id number]  Chief Complaint  Patient presents with   Follow-up    3 month haven't been wegovy  haven't been able to refill. Insurance reason. Stopped amLODipine      HPI Encounter Diagnoses  Name Primary?   Morbid obesity with BMI of 40.0-44.9, adult (HCC) Yes   Essential hypertension    For follow-up of above.  Developed lower extremity edema on the 10 mg dose of amlodipine  and patient discontinued it.  There has been less stress at work.  He has been able to lose some weight on the 1 mg dose of Wegovy .  He has been out for about 5 weeks now.  Initially had suffered some reflux but that eventually passed after a week or so.  Insurance was refusing to fill the 1 mg dose.  Insurance had indicated that he needed to be on the 1.7 mg dose.   Review of Systems  Constitutional: Negative.   HENT: Negative.    Eyes:  Negative for blurred vision, discharge and redness.  Respiratory: Negative.    Cardiovascular: Negative.   Gastrointestinal:  Negative for abdominal pain.  Genitourinary: Negative.   Musculoskeletal: Negative.  Negative for myalgias.  Skin:  Negative for rash.  Neurological:  Negative for tingling, loss of consciousness and weakness.  Endo/Heme/Allergies:  Negative for polydipsia.     Current Outpatient Medications:    fish oil-omega-3 fatty acids 1000 MG capsule, Take 2 g by mouth daily., Disp: , Rfl:    glucosamine-chondroitin 500-400 MG tablet, Take 1 tablet by mouth daily. , Disp: , Rfl:    Multiple Vitamin (MULTIVITAMIN WITH MINERALS) TABS, Take 1 tablet by mouth daily., Disp: , Rfl:    NON FORMULARY, 1,200 mg. Caralluma Fimbriata, Disp: , Rfl:    Psyllium (METAMUCIL PO), Take by mouth., Disp: , Rfl:    Semaglutide -Weight Management (WEGOVY ) 1.7 MG/0.75ML SOAJ, Inject 1.7 mg into the skin once a week., Disp: 3 mL, Rfl: 2   Turmeric POWD, by  Misc.(Non-Drug; Combo Route) route., Disp: , Rfl:    amLODipine  (NORVASC ) 10 MG tablet, Take 1 tablet (10 mg total) by mouth daily. (Patient not taking: Reported on 01/31/2024), Disp: 90 tablet, Rfl: 1   meloxicam  (MOBIC ) 15 MG tablet, Take 1 tablet (15 mg total) by mouth daily. (Patient not taking: Reported on 01/31/2024), Disp: 30 tablet, Rfl: 1   Objective:     BP 124/72 (BP Location: Right Arm, Patient Position: Sitting, Cuff Size: Normal)   Pulse 77   Temp 99.1 F (37.3 C) (Temporal)   Ht 5' 9 (1.753 m)   Wt 272 lb 12.8 oz (123.7 kg)   SpO2 99%   BMI 40.29 kg/m  BP Readings from Last 3 Encounters:  01/31/24 124/72  11/01/23 (!) 140/88  08/10/23 (!) 140/86   Wt Readings from Last 3 Encounters:  01/31/24 272 lb 12.8 oz (123.7 kg)  11/01/23 275 lb 12.8 oz (125.1 kg)  08/10/23 273 lb 6.4 oz (124 kg)      Physical Exam Constitutional:      General: He is not in acute distress.    Appearance: Normal appearance. He is not ill-appearing, toxic-appearing or diaphoretic.  HENT:     Head: Normocephalic and atraumatic.     Right Ear: External ear normal.     Left Ear: External ear normal.  Eyes:     General: No scleral icterus.  Right eye: No discharge.        Left eye: No discharge.     Extraocular Movements: Extraocular movements intact.     Conjunctiva/sclera: Conjunctivae normal.  Pulmonary:     Effort: Pulmonary effort is normal. No respiratory distress.  Skin:    General: Skin is warm and dry.  Neurological:     Mental Status: He is alert and oriented to person, place, and time.  Psychiatric:        Mood and Affect: Mood normal.        Behavior: Behavior normal.      No results found for any visits on 01/31/24.    The 10-year ASCVD risk score (Arnett DK, et al., 2019) is: 2.2%    Assessment & Plan:   Morbid obesity with BMI of 40.0-44.9, adult (HCC) -     Wegovy ; Inject 1.7 mg into the skin once a week.  Dispense: 3 mL; Refill: 2  Essential  hypertension    Return in about 8 weeks (around 03/27/2024), or Check and record blood pressures..  Information was given on preventing hypertension.  Will check and record his blood pressures over the next 8 weeks.  Increased Wegovy  to 1.7 mg.  May double up on the omeprazole  with increased reflux.  Information on Wegovy  given.  Discussed the importance of exercise and protein intake with the GLP-1 agonist.  Elsie Sim Lent, MD

## 2024-02-29 ENCOUNTER — Other Ambulatory Visit: Payer: Self-pay | Admitting: Family Medicine

## 2024-02-29 DIAGNOSIS — I10 Essential (primary) hypertension: Secondary | ICD-10-CM

## 2024-03-31 ENCOUNTER — Ambulatory Visit: Admitting: Family Medicine

## 2024-03-31 ENCOUNTER — Encounter: Payer: Self-pay | Admitting: Family Medicine

## 2024-03-31 VITALS — BP 137/86 | HR 82 | Temp 97.1°F | Resp 18 | Wt 263.8 lb

## 2024-03-31 DIAGNOSIS — I10 Essential (primary) hypertension: Secondary | ICD-10-CM

## 2024-03-31 DIAGNOSIS — Z23 Encounter for immunization: Secondary | ICD-10-CM

## 2024-03-31 DIAGNOSIS — E66812 Obesity, class 2: Secondary | ICD-10-CM

## 2024-03-31 DIAGNOSIS — E6609 Other obesity due to excess calories: Secondary | ICD-10-CM

## 2024-03-31 DIAGNOSIS — F5101 Primary insomnia: Secondary | ICD-10-CM | POA: Diagnosis not present

## 2024-03-31 DIAGNOSIS — Z6838 Body mass index (BMI) 38.0-38.9, adult: Secondary | ICD-10-CM

## 2024-03-31 MED ORDER — VALSARTAN 40 MG PO TABS: 40.0000 mg | ORAL_TABLET | Freq: Every day | ORAL | 3 refills | Status: AC

## 2024-03-31 NOTE — Progress Notes (Addendum)
 Established Patient Office Visit   Subjective:  Patient ID: Stephen Osborn, male    DOB: 12-30-1975  Age: 48 y.o. MRN: [id number]  Chief Complaint  Patient presents with   Hypertension    2 month follow up. Pt is not fasting today   HM due- vaccinations     Hypertension Pertinent negatives include no blurred vision.   Encounter Diagnoses  Name Primary?   Immunization due Yes   Essential hypertension    Class 2 obesity due to excess calories with body mass index (BMI) of 38.0 to 38.9 in adult, unspecified whether serious comorbidity present    For follow-up of above.  Developed 1 day of nausea with vomiting after starting the higher dose of Wegovy .  Fortunately that has resolved.  He has been exercising by walking with some gym work.  Has been able to lose 9 pounds since his last visit.  First hep B vaccine with flu shot today.    Review of Systems  Constitutional: Negative.   HENT: Negative.    Eyes:  Negative for blurred vision, discharge and redness.  Respiratory: Negative.    Cardiovascular: Negative.   Gastrointestinal:  Negative for abdominal pain.  Genitourinary: Negative.   Musculoskeletal: Negative.  Negative for myalgias.  Skin:  Negative for rash.  Neurological:  Negative for tingling, loss of consciousness and weakness.  Endo/Heme/Allergies:  Negative for polydipsia.      03/31/2024    9:14 AM 11/01/2023    3:56 PM 11/01/2023    2:05 PM  Depression screen PHQ 2/9  Decreased Interest 0 0 0  Down, Depressed, Hopeless 0 1 0  PHQ - 2 Score 0 1 0  Altered sleeping  3   Tired, decreased energy  1   Change in appetite  0   Feeling bad or failure about yourself   1   Trouble concentrating  1   Moving slowly or fidgety/restless  0   Suicidal thoughts  0   PHQ-9 Score  7   Difficult doing work/chores  Not difficult at all       Current Outpatient Medications:    fish oil-omega-3 fatty acids 1000 MG capsule, Take 2 g by mouth daily., Disp: , Rfl:     glycopyrrolate (ROBINUL) 2 MG tablet, Take 2-4 mg by mouth daily., Disp: , Rfl:    valsartan  (DIOVAN ) 40 MG tablet, Take 1 tablet (40 mg total) by mouth daily., Disp: 90 tablet, Rfl: 3   glucosamine-chondroitin 500-400 MG tablet, Take 1 tablet by mouth daily. , Disp: , Rfl:    meloxicam  (MOBIC ) 15 MG tablet, Take 1 tablet (15 mg total) by mouth daily. (Patient not taking: Reported on 01/31/2024), Disp: 30 tablet, Rfl: 1   Multiple Vitamin (MULTIVITAMIN WITH MINERALS) TABS, Take 1 tablet by mouth daily., Disp: , Rfl:    NON FORMULARY, 1,200 mg. Caralluma Fimbriata, Disp: , Rfl:    Psyllium (METAMUCIL PO), Take by mouth., Disp: , Rfl:    Semaglutide -Weight Management (WEGOVY ) 1.7 MG/0.75ML SOAJ, Inject 1.7 mg into the skin once a week., Disp: 3 mL, Rfl: 2   Turmeric POWD, by Misc.(Non-Drug; Combo Route) route., Disp: , Rfl:    Objective:     BP 137/86 (BP Location: Left Arm, Patient Position: Sitting, Cuff Size: Large)   Pulse 82   Temp (!) 97.1 F (36.2 C) (Temporal)   Resp 18   Wt 263 lb 12.8 oz (119.7 kg)   SpO2 96%   BMI 38.96 kg/m  BP Readings  from Last 3 Encounters:  03/31/24 137/86  01/31/24 124/72  11/01/23 (!) 140/88   Wt Readings from Last 3 Encounters:  03/31/24 263 lb 12.8 oz (119.7 kg)  01/31/24 272 lb 12.8 oz (123.7 kg)  11/01/23 275 lb 12.8 oz (125.1 kg)      Physical Exam Constitutional:      General: He is not in acute distress.    Appearance: Normal appearance. He is not ill-appearing, toxic-appearing or diaphoretic.  HENT:     Head: Normocephalic and atraumatic.     Right Ear: External ear normal.     Left Ear: External ear normal.  Eyes:     General: No scleral icterus.       Right eye: No discharge.        Left eye: No discharge.     Extraocular Movements: Extraocular movements intact.     Conjunctiva/sclera: Conjunctivae normal.  Pulmonary:     Effort: Pulmonary effort is normal. No respiratory distress.  Skin:    General: Skin is warm and dry.   Neurological:     Mental Status: He is alert and oriented to person, place, and time.  Psychiatric:        Mood and Affect: Mood normal.        Behavior: Behavior normal.      No results found for any visits on 03/31/24.    The 10-year ASCVD risk score (Arnett DK, et al., 2019) is: 2.6%    Assessment & Plan:   Immunization due -     Flu vaccine trivalent PF, 6mos and older(Flulaval,Afluria,Fluarix,Fluzone) -     Heplisav-B  (HepB-CPG) Vaccine  Essential hypertension -     Valsartan ; Take 1 tablet (40 mg total) by mouth daily.  Dispense: 90 tablet; Refill: 3  Class 2 obesity due to excess calories with body mass index (BMI) of 38.0 to 38.9 in adult, unspecified whether serious comorbidity present    Return in about 9 weeks (around 06/02/2024) for annual physical, chronic disease follow-up.  Has lost 9 pounds with Wegovy .  Will continue at current dosing.  Will start valsartan  at its lowest dose.  Will continue exercise and weight loss efforts.  Return in November for physical and follow-up.  Elsie Sim Lent, MD  10/2 addendum: Reports some difficulty staying asleep.  Morning awakenings.  Sent Rx for trazodone .  Has follow-up next month.

## 2024-04-15 ENCOUNTER — Encounter: Payer: Self-pay | Admitting: Family Medicine

## 2024-04-17 MED ORDER — TRAZODONE HCL 50 MG PO TABS: 25.0000 mg | ORAL_TABLET | Freq: Every evening | ORAL | 1 refills | Status: DC | PRN

## 2024-04-17 NOTE — Telephone Encounter (Signed)
Rx for trazodone sent

## 2024-04-17 NOTE — Addendum Note (Signed)
 Addended by: BERNETA FALLOW A on: 04/17/2024 08:30 AM   Modules accepted: Orders

## 2024-04-18 ENCOUNTER — Other Ambulatory Visit: Payer: Self-pay | Admitting: Family Medicine

## 2024-05-01 ENCOUNTER — Telehealth: Payer: Self-pay | Admitting: Family Medicine

## 2024-05-01 NOTE — Telephone Encounter (Signed)
 error

## 2024-05-09 ENCOUNTER — Other Ambulatory Visit: Payer: Self-pay | Admitting: Family Medicine

## 2024-05-09 DIAGNOSIS — F5101 Primary insomnia: Secondary | ICD-10-CM

## 2024-05-15 ENCOUNTER — Encounter: Payer: Self-pay | Admitting: Family Medicine

## 2024-05-15 ENCOUNTER — Ambulatory Visit (INDEPENDENT_AMBULATORY_CARE_PROVIDER_SITE_OTHER): Admitting: Family Medicine

## 2024-05-15 VITALS — BP 136/82 | HR 85 | Temp 97.6°F | Ht 69.0 in | Wt 267.0 lb

## 2024-05-15 DIAGNOSIS — Z0000 Encounter for general adult medical examination without abnormal findings: Secondary | ICD-10-CM

## 2024-05-15 DIAGNOSIS — Z23 Encounter for immunization: Secondary | ICD-10-CM

## 2024-05-15 DIAGNOSIS — I10 Essential (primary) hypertension: Secondary | ICD-10-CM | POA: Diagnosis not present

## 2024-05-15 DIAGNOSIS — Z6839 Body mass index (BMI) 39.0-39.9, adult: Secondary | ICD-10-CM | POA: Diagnosis not present

## 2024-05-15 DIAGNOSIS — F5101 Primary insomnia: Secondary | ICD-10-CM | POA: Diagnosis not present

## 2024-05-15 DIAGNOSIS — Z13220 Encounter for screening for lipoid disorders: Secondary | ICD-10-CM

## 2024-05-15 MED ORDER — WEGOVY 2.4 MG/0.75ML ~~LOC~~ SOAJ: 2.4000 mg | SUBCUTANEOUS | 5 refills | Status: DC

## 2024-05-15 NOTE — Progress Notes (Signed)
 Established Patient Office Visit   Subjective:  Patient ID: Stephen Osborn, male    DOB: [birth year]  Age: 48 y.o. MRN: [id number]  Chief Complaint  Patient presents with   Annual Exam    CPE. Pt is not fassting. Wants increase in Wegovy .     HPI Encounter Diagnoses  Name Primary?   Healthcare maintenance Yes   Essential hypertension    Screening for cholesterol level    BMI 39.0-39.9,adult    Immunization due    Primary insomnia    Here for physical and follow-up of above.  Continues Wegovy  for weight loss.  It has been helpful.  He continues to exercise regularly by walking and going to the gym. has been mindful of caloric intake.  Recently more stress in his life with frequent travel.  Trazodone  has been helpful for sleep.  He does not snore.  Getting more sleep has improved his outlook.  He does have regular dental care.  Due for second hep B vaccine today.   Review of Systems  Constitutional: Negative.   HENT: Negative.    Eyes:  Negative for blurred vision, discharge and redness.  Respiratory: Negative.    Cardiovascular: Negative.   Gastrointestinal:  Negative for abdominal pain.  Genitourinary: Negative.   Musculoskeletal: Negative.  Negative for myalgias.  Skin:  Negative for rash.  Neurological:  Negative for tingling, loss of consciousness and weakness.  Endo/Heme/Allergies:  Negative for polydipsia.     Current Outpatient Medications:    fish oil-omega-3 fatty acids 1000 MG capsule, Take 2 g by mouth daily., Disp: , Rfl:    glucosamine-chondroitin 500-400 MG tablet, Take 1 tablet by mouth daily. , Disp: , Rfl:    glycopyrrolate (ROBINUL) 2 MG tablet, Take 2-4 mg by mouth daily., Disp: , Rfl:    Multiple Vitamin (MULTIVITAMIN WITH MINERALS) TABS, Take 1 tablet by mouth daily., Disp: , Rfl:    NON FORMULARY, 1,200 mg. [place] Fimbriata, Disp: , Rfl:    Psyllium (METAMUCIL PO), Take by mouth., Disp: , Rfl:    semaglutide -weight management (WEGOVY ) 2.4  MG/0.75ML SOAJ SQ injection, Inject 2.4 mg into the skin once a week., Disp: 3 mL, Rfl: 5   traZODone  (DESYREL ) 50 MG tablet, Take 0.5-1 tablets (25-50 mg total) by mouth at bedtime as needed for sleep., Disp: 30 tablet, Rfl: 1   Turmeric POWD, by Misc.(Non-Drug; Combo Route) route., Disp: , Rfl:    valsartan  (DIOVAN ) 40 MG tablet, Take 1 tablet (40 mg total) by mouth daily., Disp: 90 tablet, Rfl: 3   Objective:     BP 136/82 (BP Location: Right Arm, Patient Position: Sitting, Cuff Size: Large)   Pulse 85   Temp 97.6 F (36.4 C) (Temporal)   Ht 5' 9 (1.753 m)   Wt 267 lb (121.1 kg)   SpO2 96%   BMI 39.43 kg/m  BP Readings from Last 3 Encounters:  05/15/24 136/82  03/31/24 137/86  01/31/24 124/72   Wt Readings from Last 3 Encounters:  05/15/24 267 lb (121.1 kg)  03/31/24 263 lb 12.8 oz (119.7 kg)  01/31/24 272 lb 12.8 oz (123.7 kg)      Physical Exam Constitutional:      General: He is not in acute distress.    Appearance: Normal appearance. He is not ill-appearing, toxic-appearing or diaphoretic.  HENT:     Head: Normocephalic and atraumatic.     Right Ear: Tympanic membrane, ear canal and external ear normal.     Left Ear: Tympanic  membrane, ear canal and external ear normal.     Mouth/Throat:     Mouth: Mucous membranes are moist.     Pharynx: Oropharynx is clear. No oropharyngeal exudate or posterior oropharyngeal erythema.  Eyes:     General: No scleral icterus.       Right eye: No discharge.        Left eye: No discharge.     Extraocular Movements: Extraocular movements intact.     Conjunctiva/sclera: Conjunctivae normal.     Pupils: Pupils are equal, round, and reactive to light.  Cardiovascular:     Rate and Rhythm: Normal rate and regular rhythm.  Pulmonary:     Effort: Pulmonary effort is normal. No respiratory distress.     Breath sounds: Normal breath sounds. No wheezing or rales.  Abdominal:     General: Bowel sounds are normal.     Tenderness: There  is no abdominal tenderness. There is no guarding or rebound.     Hernia: There is no hernia in the left inguinal area or right inguinal area.  Genitourinary:    Penis: Circumcised. No hypospadias, erythema, tenderness, discharge, swelling or lesions.      Testes:        Right: Mass, tenderness or swelling not present. Right testis is descended.        Left: Mass, tenderness or swelling not present. Left testis is descended.     Epididymis:     Right: Not inflamed or enlarged.     Left: Not inflamed or enlarged.  Musculoskeletal:     Cervical back: No rigidity or tenderness.  Lymphadenopathy:     Lower Body: No right inguinal adenopathy. No left inguinal adenopathy.  Skin:    General: Skin is warm and dry.  Neurological:     Mental Status: He is alert and oriented to person, place, and time.  Psychiatric:        Mood and Affect: Mood normal.        Behavior: Behavior normal.      No results found for any visits on 05/15/24.    The 10-year ASCVD risk score (Arnett DK, et al., 2019) is: 2.6%    Assessment & Plan:   Healthcare maintenance  Essential hypertension -     CBC with Differential/Platelet; Future -     Comprehensive metabolic panel with GFR; Future -     Urinalysis, Routine w reflex microscopic; Future  Screening for cholesterol level -     Comprehensive metabolic panel with GFR; Future -     Lipid panel; Future  BMI 39.0-39.9,adult -     Wegovy ; Inject 2.4 mg into the skin once a week.  Dispense: 3 mL; Refill: 5  Immunization due -     Heplisav-B  (HepB-CPG) Vaccine  Primary insomnia    Return in about 6 months (around 11/13/2024), or Return fasting for blood work..  Information given on the health maintenance and disease prevention.  Please continue regular exercise and calorie restriction for weight loss.  Information given on exercising to lose weight.  Increased Wegovy  to 2.4 mg weekly.  Discussed the importance of resistance training.  Continue  trazodone  as needed.  Information given on managing hypertension.  Will have third hep B vaccine at next visit.  Elsie Sim Lent, MD

## 2024-05-23 ENCOUNTER — Ambulatory Visit: Payer: Self-pay | Admitting: Family Medicine

## 2024-05-23 ENCOUNTER — Other Ambulatory Visit (INDEPENDENT_AMBULATORY_CARE_PROVIDER_SITE_OTHER)

## 2024-05-23 DIAGNOSIS — Z13220 Encounter for screening for lipoid disorders: Secondary | ICD-10-CM

## 2024-05-23 DIAGNOSIS — I10 Essential (primary) hypertension: Secondary | ICD-10-CM | POA: Diagnosis not present

## 2024-05-23 LAB — URINALYSIS, ROUTINE W REFLEX MICROSCOPIC
Bilirubin Urine: NEGATIVE
Hgb urine dipstick: NEGATIVE
Leukocytes,Ua: NEGATIVE
Nitrite: NEGATIVE
Specific Gravity, Urine: 1.015 (ref 1.000–1.030)
Total Protein, Urine: NEGATIVE
Urine Glucose: NEGATIVE
Urobilinogen, UA: 0.2 (ref 0.0–1.0)
pH: 5.5 (ref 5.0–8.0)

## 2024-05-23 LAB — CBC WITH DIFFERENTIAL/PLATELET
Basophils Absolute: 0.1 K/uL (ref 0.0–0.1)
Basophils Relative: 0.7 % (ref 0.0–3.0)
Eosinophils Absolute: 0.2 K/uL (ref 0.0–0.7)
Eosinophils Relative: 2.1 % (ref 0.0–5.0)
HCT: 45.1 % (ref 39.0–52.0)
Hemoglobin: 15.6 g/dL (ref 13.0–17.0)
Lymphocytes Relative: 28.8 % (ref 12.0–46.0)
Lymphs Abs: 2.3 K/uL (ref 0.7–4.0)
MCHC: 34.7 g/dL (ref 30.0–36.0)
MCV: 85.9 fl (ref 78.0–100.0)
Monocytes Absolute: 0.7 K/uL (ref 0.1–1.0)
Monocytes Relative: 8.9 % (ref 3.0–12.0)
Neutro Abs: 4.8 K/uL (ref 1.4–7.7)
Neutrophils Relative %: 59.5 % (ref 43.0–77.0)
Platelets: 301 K/uL (ref 150.0–400.0)
RBC: 5.24 Mil/uL (ref 4.22–5.81)
RDW: 13.4 % (ref 11.5–15.5)
WBC: 8 K/uL (ref 4.0–10.5)

## 2024-05-23 LAB — LIPID PANEL
Cholesterol: 183 mg/dL (ref 0–200)
HDL: 58.6 mg/dL (ref >39.00)
LDL Cholesterol: 113 mg/dL — ABNORMAL HIGH (ref 0–99)
NonHDL: 124.01
Total CHOL/HDL Ratio: 3
Triglycerides: 56 mg/dL (ref 0.0–149.0)
VLDL: 11.2 mg/dL (ref 0.0–40.0)

## 2024-05-23 LAB — COMPREHENSIVE METABOLIC PANEL WITH GFR
ALT: 28 U/L (ref 0–53)
AST: 17 U/L (ref 0–37)
Albumin: 4.3 g/dL (ref 3.5–5.2)
Alkaline Phosphatase: 51 U/L (ref 39–117)
BUN: 16 mg/dL (ref 6–23)
CO2: 26 meq/L (ref 19–32)
Calcium: 9.1 mg/dL (ref 8.4–10.5)
Chloride: 103 meq/L (ref 96–112)
Creatinine, Ser: 1.1 mg/dL (ref 0.40–1.50)
GFR: 79.16 mL/min (ref >60.00)
Glucose, Bld: 87 mg/dL (ref 70–99)
Potassium: 4 meq/L (ref 3.5–5.1)
Sodium: 138 meq/L (ref 135–145)
Total Bilirubin: 1 mg/dL (ref 0.2–1.2)
Total Protein: 6.7 g/dL (ref 6.0–8.3)

## 2024-06-02 ENCOUNTER — Encounter: Admitting: Family Medicine

## 2024-06-11 ENCOUNTER — Other Ambulatory Visit: Payer: Self-pay | Admitting: Family Medicine

## 2024-06-11 DIAGNOSIS — F5101 Primary insomnia: Secondary | ICD-10-CM

## 2024-06-18 ENCOUNTER — Encounter: Payer: Self-pay | Admitting: Family Medicine

## 2024-06-20 ENCOUNTER — Other Ambulatory Visit (HOSPITAL_COMMUNITY): Payer: Self-pay

## 2024-06-20 ENCOUNTER — Telehealth: Payer: Self-pay

## 2024-06-20 NOTE — Telephone Encounter (Signed)
 Pharmacy Patient Advocate Encounter   Received notification from Pt Calls Messages that prior authorization for (WEGOVY ) 2.4 MG/0.75ML SOAJ SQ injection  is required/requested.   Insurance verification completed.   The patient is insured through [employer].   Per test claim: PA required; PA submitted to above mentioned insurance via Latent Key/confirmation #/EOC [id number] Status is pending

## 2024-06-20 NOTE — Telephone Encounter (Signed)
 Per MyChart message patient sent in.  PA is needed for Wegovy 

## 2024-06-20 NOTE — Telephone Encounter (Signed)
 Patient called and informed

## 2024-06-20 NOTE — Telephone Encounter (Signed)
 Pharmacy Patient Advocate Encounter  Received notification from [employer] that Prior Authorization for Wegovy  2.4MG /0.75ML auto-injectors  has been APPROVED to 12.05.26. Ran test claim, Copay is $0.00. This test claim was processed through [facility]- copay amounts may vary at other pharmacies due to pharmacy/plan contracts, or as the patient moves through the different stages of their insurance plan.

## 2024-06-23 ENCOUNTER — Other Ambulatory Visit (HOSPITAL_COMMUNITY): Payer: Self-pay

## 2024-07-03 ENCOUNTER — Encounter: Admitting: Family Medicine

## 2024-07-28 ENCOUNTER — Encounter: Payer: Self-pay | Admitting: Family Medicine

## 2024-07-29 ENCOUNTER — Other Ambulatory Visit (HOSPITAL_COMMUNITY): Payer: Self-pay

## 2024-07-29 NOTE — Telephone Encounter (Signed)
 Please advise if patient has provided us  with new insurance information for 2026. The coverage we have in Epic currently is saying terminated on test claim. Patient may have new RX billing information as of the new year.

## 2024-07-29 NOTE — Telephone Encounter (Signed)
 See attached cards. Dm/cma

## 2024-07-29 NOTE — Telephone Encounter (Signed)
 Can you help with this message regarding needing a PA? Thanks Dm/cma

## 2024-08-02 ENCOUNTER — Other Ambulatory Visit: Payer: Self-pay | Admitting: Family Medicine

## 2024-08-02 DIAGNOSIS — Z6839 Body mass index (BMI) 39.0-39.9, adult: Secondary | ICD-10-CM

## 2024-08-04 ENCOUNTER — Other Ambulatory Visit (HOSPITAL_COMMUNITY): Payer: Self-pay

## 2024-08-07 NOTE — Telephone Encounter (Signed)
 Request was sent to our GLP-1 specialist. PA's can take up to a week for approval.

## 2024-08-08 ENCOUNTER — Other Ambulatory Visit (HOSPITAL_COMMUNITY): Payer: Self-pay

## 2024-08-13 ENCOUNTER — Telehealth: Payer: Self-pay

## 2024-08-13 ENCOUNTER — Other Ambulatory Visit (HOSPITAL_COMMUNITY): Payer: Self-pay

## 2024-08-13 NOTE — Telephone Encounter (Signed)
 Noted. Prior Auth Submitted and a new encounter created.

## 2024-08-13 NOTE — Telephone Encounter (Signed)
 Pharmacy Patient Advocate Encounter   Received notification from Patient Advice Request messages that prior authorization for Wegovy  2.4mg /0.36ml is required/requested.   Insurance verification completed.   The patient is insured through [employer].   Per test claim: PA required; PA submitted to above mentioned insurance via Fax Key/confirmation #/EOC [id number] Status is pending   Fax# [phone] Phone# [phone]

## 2024-08-20 ENCOUNTER — Other Ambulatory Visit (HOSPITAL_COMMUNITY): Payer: Self-pay

## 2024-08-20 NOTE — Telephone Encounter (Signed)
 Placed a call to the insurance to check the status.   Per the representative, the PA is still pending.

## 2024-11-18 ENCOUNTER — Ambulatory Visit: Admitting: Family Medicine
